# Patient Record
Sex: Female | Born: 1935 | Race: Black or African American | Hispanic: No | Smoking: Never smoker
Health system: Southern US, Community
[De-identification: ages and names within clinical notes are randomized; demographics above are authoritative.]

## PROBLEM LIST (undated history)

## (undated) DIAGNOSIS — E119 Type 2 diabetes mellitus without complications: Secondary | ICD-10-CM

## (undated) DIAGNOSIS — F32A Depression, unspecified: Secondary | ICD-10-CM

## (undated) DIAGNOSIS — F329 Major depressive disorder, single episode, unspecified: Secondary | ICD-10-CM

## (undated) DIAGNOSIS — K219 Gastro-esophageal reflux disease without esophagitis: Secondary | ICD-10-CM

## (undated) DIAGNOSIS — I10 Essential (primary) hypertension: Secondary | ICD-10-CM

## (undated) DIAGNOSIS — L539 Erythematous condition, unspecified: Secondary | ICD-10-CM

## (undated) DIAGNOSIS — M1990 Unspecified osteoarthritis, unspecified site: Secondary | ICD-10-CM

## (undated) HISTORY — DX: Type 2 diabetes mellitus without complications: E11.9

## (undated) HISTORY — PX: CHOLECYSTECTOMY: SHX55

## (undated) HISTORY — DX: Essential (primary) hypertension: I10

## (undated) HISTORY — PX: JOINT REPLACEMENT: SHX530

## (undated) HISTORY — DX: Depression, unspecified: F32.A

## (undated) HISTORY — DX: Major depressive disorder, single episode, unspecified: F32.9

---

## 2015-12-22 ENCOUNTER — Encounter (HOSPITAL_COMMUNITY): Payer: Self-pay

## 2015-12-22 ENCOUNTER — Encounter (HOSPITAL_COMMUNITY): Payer: Medicare Other | Attending: Hematology | Admitting: Hematology

## 2015-12-22 ENCOUNTER — Encounter (HOSPITAL_COMMUNITY): Payer: Medicare Other

## 2015-12-22 VITALS — BP 134/57 | HR 86 | Temp 98.7°F | Resp 18 | Ht 68.0 in | Wt 197.6 lb

## 2015-12-22 DIAGNOSIS — C259 Malignant neoplasm of pancreas, unspecified: Secondary | ICD-10-CM | POA: Insufficient documentation

## 2015-12-22 DIAGNOSIS — R300 Dysuria: Secondary | ICD-10-CM | POA: Diagnosis not present

## 2015-12-22 DIAGNOSIS — E785 Hyperlipidemia, unspecified: Secondary | ICD-10-CM | POA: Diagnosis not present

## 2015-12-22 DIAGNOSIS — I10 Essential (primary) hypertension: Secondary | ICD-10-CM | POA: Diagnosis not present

## 2015-12-22 DIAGNOSIS — E43 Unspecified severe protein-calorie malnutrition: Secondary | ICD-10-CM

## 2015-12-22 DIAGNOSIS — G893 Neoplasm related pain (acute) (chronic): Secondary | ICD-10-CM | POA: Diagnosis not present

## 2015-12-22 DIAGNOSIS — R1013 Epigastric pain: Secondary | ICD-10-CM

## 2015-12-22 DIAGNOSIS — K8681 Exocrine pancreatic insufficiency: Secondary | ICD-10-CM

## 2015-12-22 DIAGNOSIS — C250 Malignant neoplasm of head of pancreas: Secondary | ICD-10-CM | POA: Diagnosis not present

## 2015-12-22 DIAGNOSIS — R109 Unspecified abdominal pain: Secondary | ICD-10-CM | POA: Insufficient documentation

## 2015-12-22 LAB — CBC WITH DIFFERENTIAL/PLATELET
BASOS PCT: 0 %
Basophils Absolute: 0 10*3/uL (ref 0.0–0.1)
Eosinophils Absolute: 0.1 10*3/uL (ref 0.0–0.7)
Eosinophils Relative: 1 %
HEMATOCRIT: 35.5 % — AB (ref 36.0–46.0)
HEMOGLOBIN: 11.8 g/dL — AB (ref 12.0–15.0)
LYMPHS ABS: 2.8 10*3/uL (ref 0.7–4.0)
LYMPHS PCT: 45 %
MCH: 29.6 pg (ref 26.0–34.0)
MCHC: 33.2 g/dL (ref 30.0–36.0)
MCV: 89 fL (ref 78.0–100.0)
MONOS PCT: 6 %
Monocytes Absolute: 0.4 10*3/uL (ref 0.1–1.0)
NEUTROS ABS: 3 10*3/uL (ref 1.7–7.7)
NEUTROS PCT: 48 %
Platelets: 264 10*3/uL (ref 150–400)
RBC: 3.99 MIL/uL (ref 3.87–5.11)
RDW: 13.3 % (ref 11.5–15.5)
WBC: 6.3 10*3/uL (ref 4.0–10.5)

## 2015-12-22 LAB — COMPREHENSIVE METABOLIC PANEL
ALBUMIN: 3.9 g/dL (ref 3.5–5.0)
ALT: 23 U/L (ref 14–54)
AST: 21 U/L (ref 15–41)
Alkaline Phosphatase: 77 U/L (ref 38–126)
Anion gap: 8 (ref 5–15)
BUN: 6 mg/dL (ref 6–20)
CHLORIDE: 96 mmol/L — AB (ref 101–111)
CO2: 25 mmol/L (ref 22–32)
CREATININE: 0.74 mg/dL (ref 0.44–1.00)
Calcium: 8.9 mg/dL (ref 8.9–10.3)
GFR calc Af Amer: >60 mL/min (ref >60)
GFR calc non Af Amer: >60 mL/min (ref >60)
GLUCOSE: 168 mg/dL — AB (ref 65–99)
POTASSIUM: 3.5 mmol/L (ref 3.5–5.1)
SODIUM: 129 mmol/L — AB (ref 135–145)
Total Bilirubin: 0.7 mg/dL (ref 0.3–1.2)
Total Protein: 7.3 g/dL (ref 6.5–8.1)

## 2015-12-22 LAB — URINALYSIS, ROUTINE W REFLEX MICROSCOPIC
BILIRUBIN URINE: NEGATIVE
Glucose, UA: NEGATIVE mg/dL
Hgb urine dipstick: NEGATIVE
Ketones, ur: NEGATIVE mg/dL
Leukocytes, UA: NEGATIVE
NITRITE: NEGATIVE
PH: 8 (ref 5.0–8.0)
Protein, ur: NEGATIVE mg/dL
SPECIFIC GRAVITY, URINE: 1.015 (ref 1.005–1.030)

## 2015-12-22 LAB — MAGNESIUM: MAGNESIUM: 1.9 mg/dL (ref 1.7–2.4)

## 2015-12-22 MED ORDER — PANCRELIPASE (LIP-PROT-AMYL) 24000-76000 UNITS PO CPEP: 1.0000 | ORAL_CAPSULE | Freq: Three times a day (TID) | ORAL | Status: AC

## 2015-12-22 MED ORDER — SENNOSIDES-DOCUSATE SODIUM 8.6-50 MG PO TABS: 2.0000 | ORAL_TABLET | Freq: Two times a day (BID) | ORAL | Status: AC

## 2015-12-22 MED ORDER — OXYCODONE HCL 5 MG PO TABS: 5.0000 mg | ORAL_TABLET | ORAL | Status: AC | PRN

## 2015-12-22 MED ORDER — ONDANSETRON HCL 4 MG PO TABS: 4.0000 mg | ORAL_TABLET | Freq: Three times a day (TID) | ORAL | Status: AC | PRN

## 2015-12-22 NOTE — Progress Notes (Signed)
Marland Kitchen    HEMATOLOGY/ONCOLOGY CONSULTATION NOTE  Date of Service: 12/22/2015  PCP: Dr Stoney Bang MD MS  CHIEF COMPLAINTS/PURPOSE OF CONSULTATION:  Pancreatic Mass  HISTORY OF PRESENTING ILLNESS:   Colleen Riley is a wonderful 82 y.o. female who has been referred to [place] by Dr .[name], MD  for evaluation and management of newly noted pancreatic mass concerning for pancreatic cancer.  Patient has a history of hypertension, diabetes, dyslipidemia, GERD and a family history of colon cancer and pancreatic cancer who presents with several months of worsening abdominal pain, weight loss of about 25-30 pounds since March 2017 and progressive increasing fatigue. She also notes significant abdominal bloating. Patient reports she had issues with urinary retention in May 2017 and had a Foley catheter for 2 weeks after which she developed a urinary tract infection that was treated with antibiotics. Patient notes poor appetite and decreased energy. The pain is predominantly in the mid abdomen and is referred to the back. She apparently had an ultrasound of the abdomen on 12/08/2015 that showed a 3 x 2.7 x 3 cm hypoechoic ill-defined focus in the pancreatic head suggesting a pancreatic mass and pancreatic ductal dilatation of 4 mm. The patient reports that she had a CT abdomen and MRI of the abdomen at [facility] the results and images of which are not available to [place] at this time. As per her primary care physician Dr. Joselyn Arrow note her labs showed a CA-19-9 level of more than 40,000. This led to significant concern for pancreatic cancer for which she was referred to [place] for further evaluation and management.  She has several family members supporting her today. She notes that she really needs something for her pain. Notes some loose stools and a sense of bloating and fullness.   MEDICAL HISTORY:  Past Medical History  Diagnosis Date  . Diabetes mellitus without complication ([place])   .  Hypertension   . Depression   Dyslipidemia Gastroesophageal reflux disease with esophagitis Diabetic neuropathy Osteoarthritis Allergic rhinitis SURGICAL HISTORY: #1 appendectomy #2 cholecystectomy #3 right knee surgery #4 right hip surgery #5 partial thyroidectomy -noted to have benign nodule  SOCIAL HISTORY: Social History   Social History  . Marital Status: Widowed    Spouse Name: N/A  . Number of Children: N/A  . Years of Education: N/A   Occupational History  . Not on file.   Social History Main Topics  . Smoking status: Never Smoker   . Smokeless tobacco: Never Used  . Alcohol Use: No  . Drug Use: No  . Sexual Activity: No   Other Topics Concern  . Not on file   Social History Narrative  . No narrative on file  Patient is widowed and has 7 children Lifelong nonsmoker No significant alcohol use Worked for a long time with [employer] and [employer] and wonders if she had any chemical exposures.  FAMILY HISTORY: Family History  Problem Relation Age of Onset  . Heart failure Mother   . Diabetes Father   . Cancer Sister   Sister - colon cancer at 107 years Sister2-pancreatic cancer Oldest sister- end-stage renal disease on hemodialysis Sister- leukemia  ALLERGIES:  is allergic to peanut-containing drug products; claritin [loratadine]; and feldene [piroxicam].  MEDICATIONS:  Current Outpatient Prescriptions  Medication Sig Dispense Refill  . amLODipine-benazepril (LOTREL) 5-10 MG capsule Take 1 capsule by mouth daily.    Marland Kitchen aspirin 81 MG tablet Take 81 mg by mouth daily.    Marland Kitchen  cetirizine (ZYRTEC) 10 MG tablet Take 10 mg by mouth daily.    . ciprofloxacin (CIPRO) 500 MG tablet Take 500 mg by mouth 2 (two) times daily.    Marland Kitchen FLUoxetine (PROZAC) 10 MG tablet Take 10 mg by mouth daily.    Marland Kitchen glipiZIDE (GLUCOTROL) 10 MG tablet Take 10 mg by mouth daily before breakfast.    . HYDROcodone-acetaminophen (NORCO/VICODIN) 5-325 MG tablet Take 1 tablet by  mouth every 6 (six) hours as needed for moderate pain.    Marland Kitchen LORazepam (ATIVAN) 0.5 MG tablet Take 0.5 mg by mouth at bedtime as needed for anxiety.    Marland Kitchen losartan-hydrochlorothiazide (HYZAAR) 100-12.5 MG tablet Take 1 tablet by mouth daily.    Marland Kitchen lovastatin (MEVACOR) 20 MG tablet Take 20 mg by mouth at bedtime.    . montelukast (SINGULAIR) 10 MG tablet Take 10 mg by mouth at bedtime.    . Multiple Vitamins-Minerals (MULTIVITAMIN WITH MINERALS) tablet Take 1 tablet by mouth daily.    Marland Kitchen omeprazole (PRILOSEC) 40 MG capsule Take 40 mg by mouth daily.    . ondansetron (ZOFRAN) 4 MG tablet Take 4 mg by mouth every 8 (eight) hours as needed for nausea or vomiting.    . saxagliptin HCl (ONGLYZA) 5 MG TABS tablet Take 5 mg by mouth daily.     No current facility-administered medications for this visit.    REVIEW OF SYSTEMS:    10 Point review of Systems was done is negative except as noted above.  PHYSICAL EXAMINATION: ECOG PERFORMANCE STATUS: 2 - Symptomatic, <50% confined to bed  . Filed Vitals:   12/22/15 1335  BP: 134/57  Pulse: 86  Temp: 98.7 F (37.1 C)  Resp: 18   Filed Weights   12/22/15 1335  Weight: 197 lb 9.6 oz (89.631 kg)   .Body mass index is 30.05 kg/(m^2).  GENERAL:alert,Elderly African-American female in mild distress due to abdominal pain and fullness SKIN: skin color, texture, turgor are normal, no rashes or significant lesions EYES: normal, conjunctiva mild pallor, sclera clear OROPHARYNX:no exudate, no erythema and lips, buccal mucosa, and tongue normal  NECK: supple, no JVD, thyroid normal size, non-tender, without nodularity LYMPH:  no palpable lymphadenopathy in the cervical, axillary or inguinal LUNGS: clear to auscultation with normal respiratory effort HEART: regular rate & rhythm,  no murmurs and no lower extremity edema ABDOMEN:  normoactive bowel sounds , distended and somewhat tympanic, tenderness to palpation in the mid and upper abdomen with no  guarding rigidity or rebound. PSYCH: alert & oriented x 3 with fluent speech NEURO: no focal motor/sensory deficits  LABORATORY DATA:  I have reviewed the data as listed   Component     Latest Ref Rng & Units 12/22/2015  WBC     4.0 - 10.5 K/uL 6.3  RBC     3.87 - 5.11 MIL/uL 3.99  Hemoglobin     12.0 - 15.0 g/dL 11.8 (L)  HCT     36.0 - 46.0 % 35.5 (L)  MCV     78.0 - 100.0 fL 89.0  MCH     26.0 - 34.0 pg 29.6  MCHC     30.0 - 36.0 g/dL 33.2  RDW     11.5 - 15.5 % 13.3  Platelets     150 - 400 K/uL 264  Neutrophils     % 48  NEUT#     1.7 - 7.7 K/uL 3.0  Lymphocytes     % 45  Lymphocyte #  0.7 - 4.0 K/uL 2.8  Monocytes Relative     % 6  Monocyte #     0.1 - 1.0 K/uL 0.4  Eosinophil     % 1  Eosinophils Absolute     0.0 - 0.7 K/uL 0.1  Basophil     % 0  Basophils Absolute     0.0 - 0.1 K/uL 0.0  Sodium     135 - 145 mmol/L 129 (L)  Potassium     3.5 - 5.1 mmol/L 3.5  Chloride     101 - 111 mmol/L 96 (L)  CO2     22 - 32 mmol/L 25  Glucose     65 - 99 mg/dL 168 (H)  BUN     6 - 20 mg/dL 6  Creatinine     0.44 - 1.00 mg/dL 0.74  Calcium     8.9 - 10.3 mg/dL 8.9  Total Protein     6.5 - 8.1 g/dL 7.3  Albumin     3.5 - 5.0 g/dL 3.9  AST     15 - 41 U/L 21  ALT     14 - 54 U/L 23  Alkaline Phosphatase     38 - 126 U/L 77  Total Bilirubin     0.3 - 1.2 mg/dL 0.7  EGFR (Non-African Amer.)     >60 mL/min >60  EGFR (African American)     >60 mL/min >60  Anion gap     5 - 15 8  Color, Urine     YELLOW YELLOW  Appearance     CLEAR CLEAR  Specific Gravity, Urine     1.005 - 1.030 1.015  pH     5.0 - 8.0 8.0  Glucose     NEGATIVE mg/dL NEGATIVE  Hgb urine dipstick     NEGATIVE NEGATIVE  Bilirubin Urine     NEGATIVE NEGATIVE  Ketones, ur     NEGATIVE mg/dL NEGATIVE  Protein     NEGATIVE mg/dL NEGATIVE  Nitrite     NEGATIVE NEGATIVE  Leukocytes, UA     NEGATIVE NEGATIVE  Specimen Description      URINE, CLEAN CATCH  Special  Requests      NONE  Culture      MULTIPLE SPECIES PRESENT, SUGGEST RECOLLECTION (A)  Report Status      12/24/2015 FINAL  Magnesium     1.7 - 2.4 mg/dL 1.9  CA 19-9     0 - 35 U/mL 48,735 (H)   RADIOGRAPHIC STUDIES: I have personally reviewed the radiological images as listed and agreed with the findings in the report. No results found.  ASSESSMENT & PLAN:   82 year old African-American female with  #1 pancreatic head mass abdominal pain and distention and an elevated CA-19-9 level of 48k quite concerning for metastatic pancreatic cancer. ECOG performance status of 2 #2 neoplasm related pain #3 exocrine pancreatic insufficiency peripancreatic cancer #4 protein calorie malnutrition Plan -Labs including CBC, CMP ,, CA-19-9 -Try to get imaging reports and images of her CT abdomen and MRI done at [facility]. -PET/CT scan for further evaluation and complete staging and treatment strategy planning for suspected pancreatic cancer. -Would try to biopsy a metastatic lesion if easier . Otherwise the patient might need an EGD/EUS for an endoscopic biopsy of the pancreatic mass . -Pain control -oxycodone when necessary ordered along with mild prophylaxis . -We will likely need to add long-acting pain medications depending a higher need for pain medications . -  Might need to consider a celiac plexus block if she primarily has locally advanced disease as the cause of uncontrolled pain . -Creon added to help with abdominal symptoms bloating and to help with food digestion . -Nutritional consultation  -If pancreatic cancer is diagnosed and if this were locally advanced or metastatic she probably would not be a candidate for aggressive treatments given her age overall functional status and medical comorbidities and might need to consider best supportive cares versus palliative gemcitabine. -Discontinue other nonessential medications -When necessary and Zofran for nausea #5 dysuria -  urinalysis and culture ordered . -Does not show evidence of urinary tract infection this time .  #6 history of hypertension and now with lower blood pressure in the setting of significant weight loss and decreased oral intake. -Will discontinue her hydrochlorothiazide and losartan.  #7 dyslipidemia -Discontinue statins  Return to care with Dr. Whitney Muse in 10 days with repeat CBC, CMP and PET CT scan to determine further biopsy site and management plan.  All of the patients questions were answered with apparent satisfaction. The patient knows to call the clinic with any problems, questions or concerns.  I spent 60 minutes counseling the patient face to face. The total time spent in the appointment was 60 minutes and more than 50% was on counseling and direct patient cares.    Sullivan Lone MD [place] AAHIVMS [facility] Hematology/Oncology Physician [facility]  (Office):       [phone] (Work cell):  [phone] (Fax):           [phone]  12/22/2015 2:01 PM

## 2015-12-22 NOTE — Patient Instructions (Signed)
[place] at [facility] Discharge Instructions  RECOMMENDATIONS MADE BY THE CONSULTANT AND ANY TEST RESULTS WILL BE SENT TO YOUR REFERRING PHYSICIAN.  You were seen by Dr. Irene Limbo today. Lab work today.   We will also collect urine sample from you before you leave. PET scan and CT scan within the next 5 days to a week. We will ask [facility] to send over your CT abdomen and MRI results for Dr. Irene Limbo to review.   Return to clinic after PET scan and CT scan completed. Office will schedule these for you.   Stop taking your Lovastation (cholesterol pill) and HCTZ/Losartan (Blood pressure pill).   New prescriptions have been provided for pancrealipase capsules, (Take these with your meals to help digest your food), Zofran for nausea and Oxycodone for pain.   Stop taking the hydrocodone pills you have for pain because he does not want you to have tylenol.   Call clinic with any questions or concerns.   Thank you for choosing [place] at [facility] to provide your oncology and hematology care.  To afford each patient quality time with our provider, please arrive at least 15 minutes before your scheduled appointment time.   Beginning January 23rd 2017 lab work for the [facility] will be done in the  Main lab at [facility] on 1st floor. If you have a lab appointment with the [facility] please come in thru the  Main Entrance and check in at the main information desk  You need to re-schedule your appointment should you arrive 10 or more minutes late.  We strive to give you quality time with our providers, and arriving late affects you and other patients whose appointments are after yours.  Also, if you no show three or more times for appointments you may be dismissed from the clinic at the providers discretion.     Again, thank you for choosing [facility].  Our hope is that these requests will decrease the amount of time that  you wait before being seen by our physicians.       _____________________________________________________________  Should you have questions after your visit to [facility], please contact our office at [phone] between the hours of 8:30 a.m. and 4:30 p.m.  Voicemails left after 4:30 p.m. will not be returned until the following business day.  For prescription refill requests, have your pharmacy contact our office.         Resources For Cancer Patients and their Caregivers ? American Cancer Society: Can assist with transportation, wigs, general needs, runs Look Good Feel Better.        367-375-1355 ? Cancer Care: Provides financial assistance, online support groups, medication/co-pay assistance.  1-800-813-HOPE 406-629-1780) ? [place] Assists [place] cancer patients and their families through emotional , educational and financial support.  336-126-2024 ? [place] DSS Where to apply for food stamps, Medicaid and utility assistance. 223-192-6214 ? [facility]: Transportation to medical appointments. 435 670 1942 ? Social Security Administration: May apply for disability if have a Stage IV cancer. 661-595-6239 820-045-3305 ? [employer], Disability and Transit Services: Assists with nutrition, care and transit needs. [place] Support Programs: @10RELATIVEDAYS @ > Cancer Support Group  2nd Tuesday of the month 1pm-2pm, Journey Room  > Creative Journey  3rd Tuesday of the month 1130am-1pm, Journey Room  > Look Good Feel Better  1st Wednesday of the month 10am-12 noon,  Journey Room (Call [place] to register [phone])

## 2015-12-23 LAB — CANCER ANTIGEN 19-9: CA 19-9: 48735 U/mL — ABNORMAL HIGH (ref 0–35)

## 2015-12-24 LAB — URINE CULTURE

## 2015-12-30 ENCOUNTER — Ambulatory Visit (HOSPITAL_COMMUNITY)
Admission: RE | Admit: 2015-12-30 | Discharge: 2015-12-30 | Disposition: A | Discharge status: Home / Self Care | Payer: Medicare Other | Source: Ambulatory Visit | Attending: Hematology | Admitting: Hematology

## 2015-12-30 DIAGNOSIS — J439 Emphysema, unspecified: Secondary | ICD-10-CM | POA: Insufficient documentation

## 2015-12-30 DIAGNOSIS — R911 Solitary pulmonary nodule: Secondary | ICD-10-CM | POA: Diagnosis not present

## 2015-12-30 DIAGNOSIS — R590 Localized enlarged lymph nodes: Secondary | ICD-10-CM | POA: Diagnosis not present

## 2015-12-30 DIAGNOSIS — C250 Malignant neoplasm of head of pancreas: Secondary | ICD-10-CM

## 2015-12-30 DIAGNOSIS — I700 Atherosclerosis of aorta: Secondary | ICD-10-CM | POA: Insufficient documentation

## 2015-12-30 LAB — GLUCOSE, CAPILLARY: Glucose-Capillary: 170 mg/dL — ABNORMAL HIGH (ref 65–99)

## 2015-12-30 MED ORDER — FLUDEOXYGLUCOSE F - 18 (FDG) INJECTION: 9.9000 | Freq: Once | INTRAVENOUS | Status: DC | PRN

## 2016-01-05 NOTE — Progress Notes (Signed)
[name], MD [place] / [place] [address]   DIAGNOSIS: Pancreatic Mass  SUMMARY OF ONCOLOGIC HISTORY:   Pancreatic cancer ([place])   08/24/2015 Imaging    CT abdomen/pelvis with contrast, with no evidence acute intra abdominal or intrapelvic abnormality, no CT evicdence pancreatitis. no pancreatic mass or pancreatic ductal dilatation senn. Colonic diverticulosis     09/08/2015 Imaging    MRI abdomen [place] with mild increase caliber of common bile duct status post cholecystecomy. Focal narrowing within the dital common bile duct may represent stricture.      12/08/2015 [facility], 3 x 2.7 x 3 cm hypoechoic ill defined focus in the pancreatic head, pancreatic duct dilatation of 4 mm noted.      12/30/2015 PET scan    Marked hypermetabolic pancreatic head mass with hypermetabolic retroperitoneal LAD, hypermetabolic RUL pulmonary nodule, could be a metastatic lesion or primary bronchogenic neoplasm. appears to be hypermetabolic LAD in the R hilum      CURRENT THERAPY: NONE  INTERVAL HISTORY: Colleen Riley 82 y.o. female returns for follow-up of a pancreatic mass. The patient was originally seen in consultation on 12/22/2015 by Dr. Irene Limbo, at this point the details of that visit are unavailable to me. She presents today after having undergone a PET/CT at [facility] on 7/20.  She apparently has been having abdominal pain off/on for many months. She has undergone multiple modes of imaging including MR/CT and ultrasound dating back to March of this year. She is also reported to have had an elevated lipase. She is able to eat although she notes some weight loss. Denies vomiting. She still lives independently.  She presents today with multiple family members. (6 total)  They all note that the patient does not want to have a scope of any kind. They had multiple questions about surgery. She may be interested in chemotherapy.    MEDICAL HISTORY: Past  Medical History:  Diagnosis Date  . Arthritis   . Depression   . Diabetes mellitus without complication ([place])   . GERD (gastroesophageal reflux disease)   . Hypertension   . Redness    both inner thighs comes and goes    SURGICAL HISTORY: Past Surgical History:  Procedure Laterality Date  . CHOLECYSTECTOMY    . JOINT REPLACEMENT     right hip replacement  . JOINT REPLACEMENT     right knee replacement    SOCIAL HISTORY: Social History   Social History  . Marital status: Widowed    Spouse name: N/A  . Number of children: N/A  . Years of education: N/A   Occupational History  . Not on file.   Social History Main Topics  . Smoking status: Never Smoker  . Smokeless tobacco: Never Used  . Alcohol use No  . Drug use: No  . Sexual activity: No   Other Topics Concern  . Not on file   Social History Narrative  . No narrative on file  Patient lives alone, multiple children. Strong support group.  FAMILY HISTORY: Family History  Problem Relation Age of Onset  . Heart failure Mother   . Diabetes Father   . Cancer Sister     Review of Systems  Constitutional: Positive for malaise/fatigue.  HENT: Negative.   Eyes: Negative.   Respiratory: Negative.   Cardiovascular: Negative.   Gastrointestinal: Positive for abdominal pain and nausea.  Genitourinary: Negative.   Musculoskeletal: Positive for joint pain.  Skin:  Negative.   Neurological: Positive for weakness.  Endo/Heme/Allergies: Negative.   Psychiatric/Behavioral: Negative.     PHYSICAL EXAMINATION  ECOG PERFORMANCE STATUS: 1 - Symptomatic but completely ambulatory  Vitals:   01/06/16 0800  BP: 123/64  Pulse: 74  Resp: 16  Temp: 98.7 F (37.1 C)    Physical Exam  Constitutional: She is oriented to person, place, and time and well-developed, well-nourished, and in no distress.  In wheelchair  HENT:  Head: Normocephalic and atraumatic.  Nose: Nose normal.  Mouth/Throat: Oropharynx is clear and  moist. No oropharyngeal exudate.  Eyes: Conjunctivae and EOM are normal. Pupils are equal, round, and reactive to light. Right eye exhibits no discharge. Left eye exhibits no discharge. No scleral icterus.  Neck: Normal range of motion. Neck supple. No tracheal deviation present. No thyromegaly present.  Cardiovascular: Normal rate, regular rhythm and normal heart sounds.  Exam reveals no gallop and no friction rub.   No murmur heard. Pulmonary/Chest: Effort normal and breath sounds normal. She has no wheezes. She has no rales.  Abdominal: Soft. Bowel sounds are normal. She exhibits no distension and no mass. There is tenderness. There is guarding. There is no rebound.  Musculoskeletal: Normal range of motion. She exhibits no edema.  Lymphadenopathy:    She has no cervical adenopathy.  Neurological: She is alert and oriented to person, place, and time. No cranial nerve deficit. Coordination normal.  Skin: Skin is warm and dry. No rash noted.  Psychiatric: Mood, memory, affect and judgment normal.  Nursing note and vitals reviewed.   LABORATORY DATA: I have reviewed the data as listed. CBC    Component Value Date/Time   WBC 6.9 01/06/2016 0801   RBC 3.91 01/06/2016 0801   HGB 11.9 (L) 01/06/2016 0801   HCT 35.1 (L) 01/06/2016 0801   PLT 282 01/06/2016 0801   MCV 89.8 01/06/2016 0801   MCH 30.4 01/06/2016 0801   MCHC 33.9 01/06/2016 0801   RDW 13.5 01/06/2016 0801   LYMPHSABS 2.3 01/06/2016 0801   MONOABS 0.4 01/06/2016 0801   EOSABS 0.1 01/06/2016 0801   BASOSABS 0.0 01/06/2016 0801    CMP     Component Value Date/Time   NA 134 (L) 01/06/2016 0801   K 3.7 01/06/2016 0801   CL 105 01/06/2016 0801   CO2 25 01/06/2016 0801   GLUCOSE 192 (H) 01/06/2016 0801   BUN 8 01/06/2016 0801   CREATININE 0.68 01/06/2016 0801   CALCIUM 9.1 01/06/2016 0801   PROT 7.2 01/06/2016 0801   ALBUMIN 3.7 01/06/2016 0801   AST 23 01/06/2016 0801   ALT 26 01/06/2016 0801   ALKPHOS 80  01/06/2016 0801   BILITOT 0.7 01/06/2016 0801   GFRNONAA >60 01/06/2016 0801   GFRAA >60 01/06/2016 0801       RADIOGRAPHIC STUDIES: I have personally reviewed the radiological images as listed and agreed with the findings in the report. Study Result   CLINICAL DATA:  Initial treatment strategy for pancreatic cancer.  EXAM: NUCLEAR MEDICINE PET SKULL BASE TO THIGH  TECHNIQUE: 9.9 mCi F-18 FDG was injected intravenously. Full-ring PET imaging was performed from the skull base to thigh after the radiotracer. CT data was obtained and used for attenuation correction and anatomic localization.  FASTING BLOOD GLUCOSE:  Value: 170 mg/dl  COMPARISON:  None.  FINDINGS: NECK  No hypermetabolic lymph nodes in the neck.  CHEST  11 mm right upper lobe pulmonary nodule is hypermetabolic with SUV max = 9.4. Hypermetabolism in the right  hilum is suspicious for metastatic lymphadenopathy although a discrete lymph node cannot be identified on the CT scan performed without intravenous contrast material.  Lung windows show emphysema. Scattered small bilateral pulmonary nodules are below the threshold for reliable resolution on PET imaging.  ABDOMEN/PELVIS  Lesion in the head of the pancreas measures approximately 4.1 cm. Hypermetabolism in this lesion demonstrates SUV max = 8.0. Small left para-aortic lymph node (Image 102 series 4) is hypermetabolic with SUV max = 4.9 and abnormal soft tissue between the SMA in the aorta (image 105 series 4) is hypermetabolic with SUV max = 7.3.  Gallbladder surgically absent. Right renal cysts noted. Small volume intraperitoneal free fluid.  SKELETON  Patient is status post right hip replacement.  IMPRESSION: 1. Markedly hypermetabolic pancreatic head mass with hypermetabolic retroperitoneal lymphadenopathy. 2. Hypermetabolic right upper lobe pulmonary nodule. This could be a metastatic lesion or primary bronchogenic  neoplasm. There appears to be hypermetabolic lymphadenopathy in the right hilum although this is not clearly discernible on uninfused CT imaging. 3. Emphysema 4. Abdominal aortic atherosclerosis.   Electronically Signed   By: Misty Stanley M.D.   On: 12/30/2015 14:36    ASSESSMENT and THERAPY PLAN:  Pancreatic head mass RUL pulmonary nodule Abdominal pain Elevated CA 19-9 hyponatremia  I spoke with the family in detail that at this point clinical scenario is certainly c/w pancreatic carcinoma. CA 19-9 is markedly elevated. Given retroperitoneal LAD on PET/CT I suspect stage IV disease. The exact nature of the pulmonary nodule at this point is uncertain but I do not think pursuing biopsy at this point would be necessary.  Given her age and PET findings I do not feel she would be a surgical candidate but if they desire surgical consultation I would be glad to refer them to Dr. Crisoforo Oxford or Dr. Eugenia Pancoast at [facility].  I advised them that If she opted to pursue therapy she is a reasonable candidate for treatment however, it is considered standard of care to biopsy prior to treating. I would recommend referral to [facility] to Dr. Ardis Hughs for EUS and biopsy.  At this point they are not certain if they want to proceed with biopsy, they have met our patient navigator today and I advised them to call if they wish to be referred for biopsy. If they wish for surgical referral to also let me know. I am glad to assist however they decide.  All questions were answered. The patient knows to call the clinic with any problems, questions or concerns. We can certainly see the patient much sooner if necessary.  This document serves as a record of services personally performed by Ancil Linsey, MD. It was created on her behalf by Arlyce Harman, a trained medical scribe. The creation of this record is based on the scribe's personal observations and the provider's statements to them. This document has been checked and  approved by the attending provider.  I have reviewed the above documentation for accuracy and completeness, and I agree with the above.  This note was electronically signed. Molli Hazard, MD 01/25/2016

## 2016-01-06 ENCOUNTER — Encounter (HOSPITAL_BASED_OUTPATIENT_CLINIC_OR_DEPARTMENT_OTHER): Payer: Medicare Other | Admitting: Hematology & Oncology

## 2016-01-06 ENCOUNTER — Encounter (HOSPITAL_COMMUNITY): Payer: Self-pay | Admitting: Hematology & Oncology

## 2016-01-06 ENCOUNTER — Encounter (HOSPITAL_COMMUNITY): Payer: Medicare Other

## 2016-01-06 ENCOUNTER — Encounter: Payer: Self-pay | Admitting: Dietician

## 2016-01-06 VITALS — BP 123/64 | HR 74 | Temp 98.7°F | Resp 16 | Wt 197.0 lb

## 2016-01-06 DIAGNOSIS — R109 Unspecified abdominal pain: Secondary | ICD-10-CM

## 2016-01-06 DIAGNOSIS — R970 Elevated carcinoembryonic antigen [CEA]: Secondary | ICD-10-CM | POA: Diagnosis not present

## 2016-01-06 DIAGNOSIS — R911 Solitary pulmonary nodule: Secondary | ICD-10-CM

## 2016-01-06 DIAGNOSIS — C250 Malignant neoplasm of head of pancreas: Secondary | ICD-10-CM

## 2016-01-06 DIAGNOSIS — E871 Hypo-osmolality and hyponatremia: Secondary | ICD-10-CM | POA: Diagnosis not present

## 2016-01-06 LAB — COMPREHENSIVE METABOLIC PANEL
ALBUMIN: 3.7 g/dL (ref 3.5–5.0)
ALK PHOS: 80 U/L (ref 38–126)
ALT: 26 U/L (ref 14–54)
ANION GAP: 4 — AB (ref 5–15)
AST: 23 U/L (ref 15–41)
BILIRUBIN TOTAL: 0.7 mg/dL (ref 0.3–1.2)
BUN: 8 mg/dL (ref 6–20)
CALCIUM: 9.1 mg/dL (ref 8.9–10.3)
CO2: 25 mmol/L (ref 22–32)
Chloride: 105 mmol/L (ref 101–111)
Creatinine, Ser: 0.68 mg/dL (ref 0.44–1.00)
GFR calc non Af Amer: >60 mL/min (ref >60)
GLUCOSE: 192 mg/dL — AB (ref 65–99)
Potassium: 3.7 mmol/L (ref 3.5–5.1)
Sodium: 134 mmol/L — ABNORMAL LOW (ref 135–145)
TOTAL PROTEIN: 7.2 g/dL (ref 6.5–8.1)

## 2016-01-06 LAB — CBC WITH DIFFERENTIAL/PLATELET
Basophils Absolute: 0 10*3/uL (ref 0.0–0.1)
Basophils Relative: 0 %
Eosinophils Absolute: 0.1 10*3/uL (ref 0.0–0.7)
Eosinophils Relative: 1 %
HEMATOCRIT: 35.1 % — AB (ref 36.0–46.0)
HEMOGLOBIN: 11.9 g/dL — AB (ref 12.0–15.0)
LYMPHS ABS: 2.3 10*3/uL (ref 0.7–4.0)
LYMPHS PCT: 33 %
MCH: 30.4 pg (ref 26.0–34.0)
MCHC: 33.9 g/dL (ref 30.0–36.0)
MCV: 89.8 fL (ref 78.0–100.0)
MONOS PCT: 6 %
Monocytes Absolute: 0.4 10*3/uL (ref 0.1–1.0)
NEUTROS ABS: 4.1 10*3/uL (ref 1.7–7.7)
NEUTROS PCT: 60 %
Platelets: 282 10*3/uL (ref 150–400)
RBC: 3.91 MIL/uL (ref 3.87–5.11)
RDW: 13.5 % (ref 11.5–15.5)
WBC: 6.9 10*3/uL (ref 4.0–10.5)

## 2016-01-06 MED ORDER — FENTANYL 12 MCG/HR TD PT72: MEDICATED_PATCH | TRANSDERMAL | 0 refills | Status: DC

## 2016-01-06 MED ORDER — FENTANYL 12 MCG/HR TD PT72: 25.0000 ug | MEDICATED_PATCH | TRANSDERMAL | 0 refills | Status: DC

## 2016-01-06 NOTE — Progress Notes (Signed)
Patient is a new Pancreatic Cancer Pt.  Also has DM, HTN, Depression   Little documented history available.  Contacted Pt by visiting before office visit. She is accompanied by many family members.   Wt Readings from Last 10 Encounters:  01/06/16 197 lb (89.4 kg)  12/22/15 197 lb 9.6 oz (89.6 kg)   Per pt report. Her UBW was 220 lbs and family reports she weighed 222 lbs in March. This would be a 25 lb weight loss in roughly 4 months (>10%).   Patient reports oral intake as slightly decreased. She is still eating 3 meals, but her son(?) states she is eating "smaller portions" at each meal. She is not taking any oral nutritional supplements at this time. She is taking a centrum MVI. Other than her slightly decreased appetite, she is suffering from constipation, which is new and likely related to being started on pain meds. She says she started taking Senna-S. She had diarrhea prior to her diagnosis, but says she no longer does. She takes Creon now, however she is taking it after her meal. Instructed to take either with meal, or 5-10 minutes before a meal. She noted an occasion she had an "all white" stool. She thought it was because she consumed mashed potatoes.   In terms of diabetic control, she is not on Insulin. She is on glipizide and Saxagliptin. She says recently her BG checks have been nearing 200 mg/dl. She has not touched base with her endocrinologist since diagnosis and may need to get her dosages adjusted. Also, glipizide has higher risk of hypoglycemia and if her intake becomes very poor, may need changed.    RD discussed how pancreatic cancer is a nutritionally high risk cancer due to the fact the pancreas is essential in digestion/metabolism. RD told her to watch for signs of malabsorption ie steatorhea. Because pt's appetite is poorer, patient needs to prioritize calorie/protein dense foods. RD went over the "Big Three" of cheese, Peanut butter and eggs which are some of the most  common foods that are high in both kcals and protein. She says she is allergic to peanuts She has never had anaphylactic shock episode. She does eat Egg/cheese frequently. RD went over other high protein/kcal foods. Patient reports eating meat frequently. Asked her to continue this habit.   RD recommended trying to transition to 6 small meals instead of 3 large meals. Additionally, recommended she find a supplement that she likes. She notes that many of them are too sweet, but she will drink them. RD went over ensure assistance program today. Son asked for a case now. RD provided one. She will be eligible for two more.   RD went over hydration. She says she drinks a lot of fluids which she quantified as a a few bottles of water a day. She drinks sweet tea, juice and water almost exclusively. She does not like milk.   Pt seems to be fairly well nourished at this point. Family members were very receptive to all recommendations and appear to be very supportive.   RD to follow.   Left my contact info, coupons, Ensure Case, and handouts titled "Increasing Calories and Protein"   Burtis Junes RD, LDN, [place] Pager: [phone] 01/06/2016 9:00 AM

## 2016-01-06 NOTE — Patient Instructions (Signed)
[place] at [facility] Discharge Instructions  RECOMMENDATIONS MADE BY THE CONSULTANT AND ANY TEST RESULTS WILL BE SENT TO YOUR REFERRING PHYSICIAN.  You saw Dr.Penland today. We will call son with appointment. Read the constipation sheet to help with constipation. Apply one pain patch today-if pain is no better in 48 hours,remove pain patch and apply 2 pain patches. If pain is no better in 48 hours, remove the 2 pain patches and apply 3. Keep increasing patches until pain is under control. Do not run out of patches. Call before you run out.  Thank you for choosing [place] at [facility] to provide your oncology and hematology care.  To afford each patient quality time with our provider, please arrive at least 15 minutes before your scheduled appointment time.   Beginning January 23rd 2017 lab work for the [facility] will be done in the  Main lab at [facility] on 1st floor. If you have a lab appointment with the [facility] please come in thru the  Main Entrance and check in at the main information desk  You need to re-schedule your appointment should you arrive 10 or more minutes late.  We strive to give you quality time with our providers, and arriving late affects you and other patients whose appointments are after yours.  Also, if you no show three or more times for appointments you may be dismissed from the clinic at the providers discretion.     Again, thank you for choosing [facility].  Our hope is that these requests will decrease the amount of time that you wait before being seen by our physicians.       _____________________________________________________________  Should you have questions after your visit to [facility], please contact our office at [phone] between the hours of 8:30 a.m. and 4:30 p.m.  Voicemails left after 4:30 p.m. will not be returned until the following business day.  For  prescription refill requests, have your pharmacy contact our office.         Resources For Cancer Patients and their Caregivers ? American Cancer Society: Can assist with transportation, wigs, general needs, runs Look Good Feel Better.        913-164-7541 ? Cancer Care: Provides financial assistance, online support groups, medication/co-pay assistance.  1-800-813-HOPE 716 768 2497) ? [facility] Assists [place] cancer patients and their families through emotional , educational and financial support.  430-330-1133 ? [place] DSS Where to apply for food stamps, Medicaid and utility assistance. (502) 145-6935 ? [facility]: Transportation to medical appointments. 323-153-1729 ? Social Security Administration: May apply for disability if have a Stage IV cancer. 757-468-3571 5877601039 ? [employer], Disability and Transit Services: Assists with nutrition, care and transit needs. [place] Support Programs: @10RELATIVEDAYS @ > Cancer Support Group  2nd Tuesday of the month 1pm-2pm, Journey Room  > Creative Journey  3rd Tuesday of the month 1130am-1pm, Journey Room  > Look Good Feel Better  1st Wednesday of the month 10am-12 noon, Journey Room (Call [facility] to register [phone])   ]fentanyl skin patch What is this medicine? FENTANYL (FEN ta nil) is a pain reliever. It is used to treat persistent, moderate to severe chronic pain. It is used only by people who have been taking an opioid or narcotic pain medicine for more than one week. This medicine may be used for other purposes; ask your health care provider  or pharmacist if you have questions. What should I tell my health care provider before I take this medicine? They need to know if you have any of these conditions: -brain tumor -Crohn's disease, inflammatory bowel disease, or ulcerative colitis -drug abuse or addiction -head injury -heart disease -if  you frequently drink alcohol containing drinks -kidney disease or problems going to the bathroom -liver disease -lung disease, asthma, or breathing problems -mental problems -skin problems -taken isocarboxazid, phenelzine, tranylcypromine, or selegiline in the past 2 weeks -an allergic or unusual reaction to fentanyl, meperidine, other medicines, foods, dyes, or preservatives -pregnant or trying to get pregnant -breast-feeding How should I use this medicine? Apply the patch to your skin. Do not cut or damage the patch. A cut or damaged patch can be very dangerous because you may get too much medicine. Select a clean, dry area of skin above your waist on your front or back. The upper back is a good spot to put the patch on children or people who are confused because it will be hard for them to remove the patch. Do not apply the patch to oily, broken, burned, cut, or irritated skin. Use only water to clean the area. Do not use soap or alcohol to clean the skin because this can increase the effects of the medicine. If the area is hairy, clip the hair with scissors, but do not shave. Take the patch out of its wrapper, and take off the protective strip over the sticky part. Do not use a patch if the packaging or backing is damaged. Do not touch the sticky part with your fingers. Press the sticky surface to the skin using the palm of your hand. Press the patch to the skin for 30 seconds. Wash your hands at once with soap and water. Keep patches far away from children. Do not let children see you apply the patch and do not apply it where children can see it. Do not call the patch a sticker, tattoo, or bandage, as this could encourage the child to mimic your actions. Used patches still contain medicine. Children or pets can have serious side effects or die from putting used patches in their mouth or on their bodies. Take off the old patch before putting on a new patch. Apply each new patch to a different area  of skin. If a patch comes off or causes irritation, remove it and apply a new patch to different site. If the edges of the patch start to loosen, first apply first aid tape to the edges of the patch. If problems with the patch not sticking continue, cover the patch with a see-through adhesive dressing (like Bioclusive or Tegaderm). Never cover the patch with any other bandage or tape. To get rid of used patches, fold the patch in half with the sticky sides together. Then, flush it down the toilet. Do not discard the patch in the garbage. Pets and children can be harmed if they find used or lost patches. Replace the patch every 3 days or as directed by your doctor or health care professional. Follow the directions on the prescription label. Do not take more medicine than you are told to take. A special MedGuide will be given to you by the pharmacist with each prescription and refill. Be sure to read this information carefully each time. Talk to your pediatrician regarding the use of this medicine in children. While this drug may be prescribed for children as young as 75 years old for selected  conditions, precautions do apply. If someone accidentally uses a fentanyl patch and is not awake and alert, immediately call 911 for help. If the person is awake and alert, call a doctor, health care professional, or the [employer]. Overdosage: If you think you have taken too much of this medicine contact a poison control center or emergency room at once. NOTE: This medicine is only for you. Do not share this medicine with others. What if I miss a dose? If you forget to replace your patch, take off the old patch and put on a new patch as soon as you can. Do not apply an extra patch to your skin. Do not wear more than one patch at the same time unless told to do so by your doctor or health care professional. What may interact with this  medicine? -alcohol -antihistamines -clarithromycin -diltiazem -erythromycin -grapefruit juice -herbal products that contain St. John's wort -itraconazole -ketoconazole -medicines for depression, anxiety, or psychotic disturbances -medicines for sleep -muscle relaxants -naltrexone -narcotic medicines (opiates) for pain -nelfinavir -nicardipine -phenobarbital, phenytoin, or fosphenytoin -rifampin -ritonavir -troleandomycin -verapamil This list may not describe all possible interactions. Give your health care provider a list of all the medicines, herbs, non-prescription drugs, or dietary supplements you use. Also tell them if you smoke, drink alcohol, or use illegal drugs. Some items may interact with your medicine. What should I watch for while using this medicine? Other pain medicine may be needed the first day you use the patch because the patch can take some time to start working. Tell your doctor or health care professional if your pain does not go away, if it gets worse, or if you have new or a different type of pain. You may develop tolerance to the medicine. Tolerance means that you will need a higher dose of the medicine for pain relief. Tolerance is normal and is expected if you take the medicine for a long time. Have a family member watch you for side effects during the first day you wear a patch or if your dose changes. Do not suddenly stop taking your medicine because you may develop a severe reaction. Your body becomes used to the medicine. This does NOT mean you are addicted. Addiction is a behavior related to getting and using a drug for a non-medical reason. If you have pain, you have a medical reason to take pain medicine. Your doctor will tell you how much medicine to take. If your doctor wants you to stop the medicine, the dose will be slowly lowered over time to avoid any side effects. You may get drowsy or dizzy when you first start taking the medicine or change doses.  Do not drive, use machinery, or do anything that may be dangerous until you know how the medicine affects you. Stand or sit up slowly. There are different types of narcotic medicines (opiates) for pain. If you take more than one type at the same time, you may have more side effects. Give your health care provider a list of all medicines you use. Your doctor will tell you how much medicine to take. Do not take more medicine than directed. Call emergency for help if you have problems breathing. The medicine will cause constipation. Try to have a bowel movement at least every 2 to 3 days. If you do not have a bowel movement for 3 days, call your doctor or health care professional. Your mouth may get dry. Drinking water, chewing sugarless gum, or sucking on hard candy  may help. See your dentist every 6 months. Heat can increase the amount of medicine released from the patch. Do not get the patch hot by using heating pads, heated water beds, electric blankets, and heat lamps. You can bathe or swim while using the patch. But, do not use a sauna or hot tub. Tell you doctor or health care professional if you get a fever. If gel leaks from the patch, wash your skin well with water only. Do not use soap or cleansers containing alcohol. Remove the patch from your skin and discard as instructed above. Medicine from a partly attached or leaking patch can transfer to a child or pet when they are being held. Exposure of children or pets to the patch can cause serious side effects and even death. If you are going to have a magnetic resonance imaging (MRI) procedure, tell your MRI technician if you have this patch on your body. It must be removed before a MRI. What side effects may I notice from receiving this medicine? Side effects that you should report to your doctor or health care professional as soon as possible: -allergic reactions like skin rash, itching or hives, swelling of the face, lips, or tongue -breathing  problems -changes in vision -confusion -feeling faint, lightheaded -fever, flu-like symptoms -hallucination -high or low blood pressure -irregular heartbeat -problems with balance, talking, walking -trouble passing urine or change in the amount of urine -unusual bleeding or bruising -unusually weak or tired Side effects that usually do not require medical attention (report to your doctor or health care professional if they continue or are bothersome): -constipation -dry mouth -itching where the patch was applied -loss of appetite -nausea, vomiting -sweating This list may not describe all possible side effects. Call your doctor for medical advice about side effects. You may report side effects to FDA at 1-800-FDA-1088. Where should I keep my medicine? Keep out of the reach of children. This medicine can be abused. Keep your medicine in a safe place to protect it from theft. Do not share this medicine with anyone. Selling or giving away this medicine is dangerous and against the law. Store below 77 degrees F (25 degrees C). Do not store the patches out of their wrappers. This medicine may cause accidental overdose and death if it is taken by other adults, children, or pets. Flush any unused medicine down the toilet as instructed above to reduce the chance of harm. Do not use the medicine after the expiration date. NOTE: This sheet is a summary. It may not cover all possible information. If you have questions about this medicine, talk to your doctor, pharmacist, or health care provider.    2016, Elsevier/Gold Standard. (2012-12-25 17:25:22)

## 2016-01-07 ENCOUNTER — Telehealth (HOSPITAL_COMMUNITY): Payer: Self-pay | Admitting: *Deleted

## 2016-01-07 ENCOUNTER — Other Ambulatory Visit (HOSPITAL_COMMUNITY): Payer: Self-pay | Admitting: Emergency Medicine

## 2016-01-07 NOTE — Progress Notes (Signed)
Called pts son to go over labs.  Dr Ardis Hughs will be in touch with them about scheduled his mothers EUS.  If they have not heard anything by Monday to please call us back.  Verbalized understanding.

## 2016-01-07 NOTE — Telephone Encounter (Signed)
Yes I took care of this

## 2016-01-08 ENCOUNTER — Encounter (HOSPITAL_COMMUNITY): Payer: Self-pay | Admitting: Hematology & Oncology

## 2016-01-10 ENCOUNTER — Other Ambulatory Visit (HOSPITAL_COMMUNITY): Payer: Self-pay | Admitting: Emergency Medicine

## 2016-01-10 ENCOUNTER — Other Ambulatory Visit: Payer: Self-pay

## 2016-01-10 ENCOUNTER — Telehealth: Payer: Self-pay

## 2016-01-10 ENCOUNTER — Encounter (HOSPITAL_COMMUNITY): Payer: Self-pay | Admitting: Hematology & Oncology

## 2016-01-10 DIAGNOSIS — K8689 Other specified diseases of pancreas: Secondary | ICD-10-CM

## 2016-01-10 NOTE — Progress Notes (Unsigned)
Son Fulton Reek called and wanted to know about Dr Ardis Hughs appt.  I called Dr Ardis Hughs office.  Dr Ardis Hughs got the message this morning and they will be calling with the appt soon.  As far as the [place], I gave Fulton Reek my e-mail address so he could attach the form and I could look at it to see what I could fill out for him.  I also contacted Abby Potash and she said she would help me tomorrow with filling the paper work out.  Benny verbalized understanding.

## 2016-01-10 NOTE — Telephone Encounter (Signed)
Left message on machine to call back EUS 01/13/16 1130 am instructions on My Chart

## 2016-01-10 NOTE — Telephone Encounter (Signed)
EUS scheduled, pt instructed and medications reviewed.  Patient instructions mailed to home.  Patient to call with any questions or concerns.  

## 2016-01-10 NOTE — Telephone Encounter (Signed)
-----   Message from Milus Banister, MD sent at 01/10/2016  6:57 AM EDT ----- Vashti Hey work on the EUS.  thanks  Nikia Levels, She needs upper EUS, radial +/- linear, this Thursday (3rd) to follow my 10:30 case, ++MAC.  For pancreatic mass.   Thanks    ----- Message ----- From: Baird Cancer, PA-C Sent: 01/06/2016   5:31 PM To: Milus Banister, MD  Thank you.  Yes, can you please get her on your schedule for EUS FNA.  We will let the family know that she is not a surgical candidate and go from there in that regard.  Dr. Irene Limbo, who has been helping [place] from [place] with the influx of new patients saw her in consultation.  His note is not yet available.  HOWEVER, Dr. Whitney Muse said she will get her note done for you (from today).  Thanks  TK ----- Message ----- From: Milus Banister, MD Sent: 01/06/2016  11:05 AM To: Baird Cancer, PA-C  Ok, I was able to see the PET images. The mass in pancreas is definitely amenable to EUS FNA.  I'm happy to get her on schedule for that if you'd like.  No surgeon would offer her a pancreatic surgery at her age, with PET findings. Again, though, sometimes people need to hear that from a surgeon and so if she, family insists then I think referring her to surgeon is reasonable.  Can you forward me copy of office note when available also. Thanks  DJ

## 2016-01-11 ENCOUNTER — Encounter: Payer: Self-pay | Admitting: *Deleted

## 2016-01-11 ENCOUNTER — Encounter (HOSPITAL_COMMUNITY): Payer: Self-pay | Admitting: *Deleted

## 2016-01-11 ENCOUNTER — Encounter (HOSPITAL_COMMUNITY): Payer: Self-pay | Admitting: Emergency Medicine

## 2016-01-11 ENCOUNTER — Telehealth: Payer: Self-pay | Admitting: Gastroenterology

## 2016-01-11 NOTE — Progress Notes (Unsigned)
Called son ben and let him know that it would be good standard of care that we would need a tissue diagnsis before we start treatment.  We would get this through the EUS.  If we started treatment without  Tissue and the pt did not respond we would not know where to go from there. Ben verbalized understanding of all of this.

## 2016-01-11 NOTE — Progress Notes (Signed)
[facility] [facility] Clinical Social Work  Clinical Social Work was referred by nurse for assessment of psychosocial needs due to questions about Cancer Care. Clinical Social Worker contacted patient's son and left detailed message at home. Son returned call and CSW answered questions about Cancer Care process. Pt needs treatment plan and diagnosis in order to complete paperwork fully. Pt lives alone, but has strong support from extended family and her church. CSW to follow and educated son on additional support programs locally and at [facility].     Clinical Social Work interventions: Education and referral  [place], [place] Tuesdays   [phone]

## 2016-01-11 NOTE — Progress Notes (Signed)
Colleen Riley called and wanted to put off the EUS.  I told him to contact Dr Ardis Hughs office to re-schedule this appointment.

## 2016-01-12 ENCOUNTER — Other Ambulatory Visit (HOSPITAL_COMMUNITY): Payer: Self-pay | Admitting: Hematology & Oncology

## 2016-01-12 NOTE — Telephone Encounter (Signed)
Call placed to the number left on my voice mail regarding the pt and the number is not a good working number.  Message left on the home number again to have the pt return call.

## 2016-01-12 NOTE — Telephone Encounter (Signed)
The pt appt has been rescheduled to 01/27/16 per son request

## 2016-01-12 NOTE — Telephone Encounter (Signed)
Left message on machine to call back  

## 2016-01-13 ENCOUNTER — Encounter (HOSPITAL_COMMUNITY): Payer: Self-pay

## 2016-01-13 MED ORDER — FENTANYL 25 MCG/HR TD PT72: 25.0000 ug | MEDICATED_PATCH | TRANSDERMAL | 0 refills | Status: DC

## 2016-01-14 ENCOUNTER — Encounter (HOSPITAL_COMMUNITY): Payer: Self-pay | Admitting: *Deleted

## 2016-01-24 ENCOUNTER — Encounter (HOSPITAL_COMMUNITY): Payer: Self-pay | Admitting: Hematology & Oncology

## 2016-01-25 ENCOUNTER — Telehealth (HOSPITAL_COMMUNITY): Payer: Self-pay | Admitting: Emergency Medicine

## 2016-01-25 ENCOUNTER — Encounter (HOSPITAL_COMMUNITY): Payer: Self-pay

## 2016-01-25 ENCOUNTER — Encounter (HOSPITAL_COMMUNITY): Payer: Self-pay | Admitting: Hematology & Oncology

## 2016-01-25 NOTE — Progress Notes (Unsigned)
Son was called after all information of symptoms and current medications for pain control were discussed with Robynn Pane, PA.  PA suggests to either be sure to administer the Oxycodone 5mg  Q4H for breakthrough pain, or to add a 12.71mcg Fentanyl patch in addition to the 32mcg that we just increased her to on 01/12/16.  They are to report any further complication or uncontrolled symptoms, if any, in 24 hours.  Son verbalized understanding.

## 2016-01-25 NOTE — Telephone Encounter (Signed)
Called Colleen Riley to let him know about follow up appt with Dr Whitney Muse on 02/07/2016.  He is very open to hospice if and when the time comes.  The other family member will need to hear this.  Colleen Riley has been in a lot more pain.  He added another 12.63mcg patch this am.  Making the dose 37.6mcg.  I told him to call if this did not help.  He was very Patent attorney.

## 2016-01-26 NOTE — Anesthesia Preprocedure Evaluation (Addendum)
Anesthesia Evaluation  Patient identified by MRN, date of birth, ID band  Reviewed: Allergy & Precautions, NPO status , Patient's Chart, lab work & pertinent test results  Airway Mallampati: III  TM Distance: >3 FB Neck ROM: Full    Dental  (+) Upper Dentures, Lower Dentures   Pulmonary neg pulmonary ROS,    Pulmonary exam normal        Cardiovascular hypertension, Pt. on medications Normal cardiovascular exam     Neuro/Psych PSYCHIATRIC DISORDERS Depression    GI/Hepatic GERD  ,Pancreatic cancer   Endo/Other  diabetes, Type 2  Renal/GU negative Renal ROS     Musculoskeletal   Abdominal   Peds  Hematology   Anesthesia Other Findings   Reproductive/Obstetrics                            Anesthesia Physical Anesthesia Plan  ASA: III  Anesthesia Plan: MAC   Post-op Pain Management:    Induction: Intravenous  Airway Management Planned: Nasal Cannula  Additional Equipment: None  Intra-op Plan:   Post-operative Plan:   Informed Consent: I have reviewed the patients History and Physical, chart, labs and discussed the procedure including the risks, benefits and alternatives for the proposed anesthesia with the patient or authorized representative who has indicated his/her understanding and acceptance.   Dental advisory given  Plan Discussed with: CRNA and Surgeon  Anesthesia Plan Comments:         Anesthesia Quick Evaluation

## 2016-01-27 ENCOUNTER — Ambulatory Visit (HOSPITAL_COMMUNITY): Payer: Medicare Other | Admitting: Anesthesiology

## 2016-01-27 ENCOUNTER — Encounter (HOSPITAL_COMMUNITY)
Admission: RE | Disposition: A | Discharge status: Home Health | Payer: Self-pay | Source: Ambulatory Visit | Attending: Gastroenterology

## 2016-01-27 ENCOUNTER — Ambulatory Visit (HOSPITAL_COMMUNITY)
Admission: RE | Admit: 2016-01-27 | Discharge: 2016-01-27 | Disposition: A | Discharge status: Home Health | Payer: Medicare Other | Source: Ambulatory Visit | Attending: Gastroenterology | Admitting: Gastroenterology

## 2016-01-27 ENCOUNTER — Encounter (HOSPITAL_COMMUNITY): Payer: Self-pay | Admitting: Gastroenterology

## 2016-01-27 DIAGNOSIS — E119 Type 2 diabetes mellitus without complications: Secondary | ICD-10-CM | POA: Diagnosis not present

## 2016-01-27 DIAGNOSIS — F329 Major depressive disorder, single episode, unspecified: Secondary | ICD-10-CM | POA: Diagnosis not present

## 2016-01-27 DIAGNOSIS — I10 Essential (primary) hypertension: Secondary | ICD-10-CM | POA: Insufficient documentation

## 2016-01-27 DIAGNOSIS — Z79899 Other long term (current) drug therapy: Secondary | ICD-10-CM | POA: Insufficient documentation

## 2016-01-27 DIAGNOSIS — Z7982 Long term (current) use of aspirin: Secondary | ICD-10-CM | POA: Insufficient documentation

## 2016-01-27 DIAGNOSIS — C259 Malignant neoplasm of pancreas, unspecified: Secondary | ICD-10-CM | POA: Insufficient documentation

## 2016-01-27 DIAGNOSIS — K219 Gastro-esophageal reflux disease without esophagitis: Secondary | ICD-10-CM | POA: Insufficient documentation

## 2016-01-27 DIAGNOSIS — K8689 Other specified diseases of pancreas: Secondary | ICD-10-CM

## 2016-01-27 DIAGNOSIS — K869 Disease of pancreas, unspecified: Secondary | ICD-10-CM | POA: Diagnosis present

## 2016-01-27 HISTORY — DX: Gastro-esophageal reflux disease without esophagitis: K21.9

## 2016-01-27 HISTORY — DX: Erythematous condition, unspecified: L53.9

## 2016-01-27 HISTORY — PX: EUS: SHX5427

## 2016-01-27 HISTORY — DX: Unspecified osteoarthritis, unspecified site: M19.90

## 2016-01-27 LAB — GLUCOSE, CAPILLARY: GLUCOSE-CAPILLARY: 168 mg/dL — AB (ref 65–99)

## 2016-01-27 SURGERY — ESOPHAGEAL ENDOSCOPIC ULTRASOUND (EUS) RADIAL
Anesthesia: Monitor Anesthesia Care

## 2016-01-27 MED ORDER — PROPOFOL 10 MG/ML IV BOLUS
INTRAVENOUS | Status: AC
  Filled 2016-01-27: qty 20

## 2016-01-27 MED ORDER — LIDOCAINE HCL (CARDIAC) 20 MG/ML IV SOLN
INTRAVENOUS | Status: AC
  Filled 2016-01-27: qty 5

## 2016-01-27 MED ORDER — LIDOCAINE HCL (CARDIAC) 20 MG/ML IV SOLN
INTRAVENOUS | Status: DC | PRN
  Administered 2016-01-27: 75 mg via INTRAVENOUS

## 2016-01-27 MED ORDER — LABETALOL HCL 5 MG/ML IV SOLN
INTRAVENOUS | Status: DC | PRN
  Administered 2016-01-27: 5 mg via INTRAVENOUS

## 2016-01-27 MED ORDER — GLYCOPYRROLATE 0.2 MG/ML IJ SOLN
INTRAMUSCULAR | Status: DC | PRN
  Administered 2016-01-27: 0.2 mg via INTRAVENOUS

## 2016-01-27 MED ORDER — SODIUM CHLORIDE 0.9 % IV SOLN: INTRAVENOUS | Status: DC

## 2016-01-27 MED ORDER — GLYCOPYRROLATE 0.2 MG/ML IJ SOLN
INTRAMUSCULAR | Status: AC
  Filled 2016-01-27: qty 1

## 2016-01-27 MED ORDER — PROPOFOL 10 MG/ML IV BOLUS
INTRAVENOUS | Status: AC
  Filled 2016-01-27: qty 40

## 2016-01-27 MED ORDER — PROPOFOL 500 MG/50ML IV EMUL
INTRAVENOUS | Status: DC | PRN
  Administered 2016-01-27: 300 ug/kg/min via INTRAVENOUS

## 2016-01-27 MED ORDER — LACTATED RINGERS IV SOLN
INTRAVENOUS | Status: DC
  Administered 2016-01-27: 1000 mL via INTRAVENOUS

## 2016-01-27 MED ORDER — LABETALOL HCL 5 MG/ML IV SOLN
INTRAVENOUS | Status: AC
  Filled 2016-01-27: qty 4

## 2016-01-27 NOTE — Interval H&P Note (Signed)
History and Physical Interval Note:  01/27/2016 7:17 AM  Colleen Riley  has presented today for surgery, with the diagnosis of pancreatic mass  The various methods of treatment have been discussed with the patient and family. After consideration of risks, benefits and other options for treatment, the patient has consented to  Procedure(s): ESOPHAGEAL ENDOSCOPIC ULTRASOUND (EUS) RADIAL (N/A) as a surgical intervention .  The patient's history has been reviewed, patient examined, no change in status, stable for surgery.  I have reviewed the patient's chart and labs.  Questions were answered to the patient's satisfaction.     Milus Banister

## 2016-01-27 NOTE — Transfer of Care (Signed)
Immediate Anesthesia Transfer of Care Note  Patient: Colleen Riley  Procedure(s) Performed: Procedure(s): ESOPHAGEAL ENDOSCOPIC ULTRASOUND (EUS) RADIAL (N/A)  Patient Location: PACU  Anesthesia Type:MAC  Level of Consciousness: awake, alert , oriented and patient cooperative  Airway & Oxygen Therapy: Patient Spontanous Breathing and Patient connected to nasal cannula oxygen  Post-op Assessment: Report given to RN, Post -op Vital signs reviewed and stable and Patient moving all extremities X 4  Post vital signs: stable  Last Vitals:  Vitals:   01/27/16 0732 01/27/16 0859  BP: (!) 143/85   Pulse: 69 83  Resp: 10 15  Temp: 36.9 C     Last Pain:  Vitals:   01/27/16 0732  TempSrc: Oral         Complications: No apparent anesthesia complications  BP UP labetolol given in pacu

## 2016-01-27 NOTE — Discharge Instructions (Signed)

## 2016-01-27 NOTE — H&P (View-Only) (Signed)
[name], MD [place] / [place] [address]   DIAGNOSIS: Pancreatic Mass  SUMMARY OF ONCOLOGIC HISTORY:   Pancreatic cancer ([place])   08/24/2015 Imaging    CT abdomen/pelvis with contrast, with no evidence acute intra abdominal or intrapelvic abnormality, no CT evicdence pancreatitis. no pancreatic mass or pancreatic ductal dilatation senn. Colonic diverticulosis     09/08/2015 Imaging    MRI abdomen [place] with mild increase caliber of common bile duct status post cholecystecomy. Focal narrowing within the dital common bile duct may represent stricture.      12/08/2015 [facility], 3 x 2.7 x 3 cm hypoechoic ill defined focus in the pancreatic head, pancreatic duct dilatation of 4 mm noted.      12/30/2015 PET scan    Marked hypermetabolic pancreatic head mass with hypermetabolic retroperitoneal LAD, hypermetabolic RUL pulmonary nodule, could be a metastatic lesion or primary bronchogenic neoplasm. appears to be hypermetabolic LAD in the R hilum      CURRENT THERAPY: NONE  INTERVAL HISTORY: Colleen Riley 82 y.o. female returns for follow-up of a pancreatic mass. The patient was originally seen in consultation on 12/22/2015 by Dr. Irene Limbo, at this point the details of that visit are unavailable to me. She presents today after having undergone a PET/CT at [facility] on 7/20.  She apparently has been having abdominal pain off/on for many months. She has undergone multiple modes of imaging including MR/CT and ultrasound dating back to March of this year. She is also reported to have had an elevated lipase. She is able to eat although she notes some weight loss. Denies vomiting. She still lives independently.  She presents today with multiple family members. (6 total)  They all note that the patient does not want to have a scope of any kind. They had multiple questions about surgery. She may be interested in chemotherapy.    MEDICAL HISTORY: Past  Medical History:  Diagnosis Date  . Arthritis   . Depression   . Diabetes mellitus without complication ([place])   . GERD (gastroesophageal reflux disease)   . Hypertension   . Redness    both inner thighs comes and goes    SURGICAL HISTORY: Past Surgical History:  Procedure Laterality Date  . CHOLECYSTECTOMY    . JOINT REPLACEMENT     right hip replacement  . JOINT REPLACEMENT     right knee replacement    SOCIAL HISTORY: Social History   Social History  . Marital status: Widowed    Spouse name: N/A  . Number of children: N/A  . Years of education: N/A   Occupational History  . Not on file.   Social History Main Topics  . Smoking status: Never Smoker  . Smokeless tobacco: Never Used  . Alcohol use No  . Drug use: No  . Sexual activity: No   Other Topics Concern  . Not on file   Social History Narrative  . No narrative on file  Patient lives alone, multiple children. Strong support group.  FAMILY HISTORY: Family History  Problem Relation Age of Onset  . Heart failure Mother   . Diabetes Father   . Cancer Sister     Review of Systems  Constitutional: Positive for malaise/fatigue.  HENT: Negative.   Eyes: Negative.   Respiratory: Negative.   Cardiovascular: Negative.   Gastrointestinal: Positive for abdominal pain and nausea.  Genitourinary: Negative.   Musculoskeletal: Positive for joint pain.  Skin:  Negative.   Neurological: Positive for weakness.  Endo/Heme/Allergies: Negative.   Psychiatric/Behavioral: Negative.     PHYSICAL EXAMINATION  ECOG PERFORMANCE STATUS: 1 - Symptomatic but completely ambulatory  Vitals:   01/06/16 0800  BP: 123/64  Pulse: 74  Resp: 16  Temp: 98.7 F (37.1 C)    Physical Exam  Constitutional: She is oriented to person, place, and time and well-developed, well-nourished, and in no distress.  In wheelchair  HENT:  Head: Normocephalic and atraumatic.  Nose: Nose normal.  Mouth/Throat: Oropharynx is clear and  moist. No oropharyngeal exudate.  Eyes: Conjunctivae and EOM are normal. Pupils are equal, round, and reactive to light. Right eye exhibits no discharge. Left eye exhibits no discharge. No scleral icterus.  Neck: Normal range of motion. Neck supple. No tracheal deviation present. No thyromegaly present.  Cardiovascular: Normal rate, regular rhythm and normal heart sounds.  Exam reveals no gallop and no friction rub.   No murmur heard. Pulmonary/Chest: Effort normal and breath sounds normal. She has no wheezes. She has no rales.  Abdominal: Soft. Bowel sounds are normal. She exhibits no distension and no mass. There is tenderness. There is guarding. There is no rebound.  Musculoskeletal: Normal range of motion. She exhibits no edema.  Lymphadenopathy:    She has no cervical adenopathy.  Neurological: She is alert and oriented to person, place, and time. No cranial nerve deficit. Coordination normal.  Skin: Skin is warm and dry. No rash noted.  Psychiatric: Mood, memory, affect and judgment normal.  Nursing note and vitals reviewed.   LABORATORY DATA: I have reviewed the data as listed. CBC    Component Value Date/Time   WBC 6.9 01/06/2016 0801   RBC 3.91 01/06/2016 0801   HGB 11.9 (L) 01/06/2016 0801   HCT 35.1 (L) 01/06/2016 0801   PLT 282 01/06/2016 0801   MCV 89.8 01/06/2016 0801   MCH 30.4 01/06/2016 0801   MCHC 33.9 01/06/2016 0801   RDW 13.5 01/06/2016 0801   LYMPHSABS 2.3 01/06/2016 0801   MONOABS 0.4 01/06/2016 0801   EOSABS 0.1 01/06/2016 0801   BASOSABS 0.0 01/06/2016 0801    CMP     Component Value Date/Time   NA 134 (L) 01/06/2016 0801   K 3.7 01/06/2016 0801   CL 105 01/06/2016 0801   CO2 25 01/06/2016 0801   GLUCOSE 192 (H) 01/06/2016 0801   BUN 8 01/06/2016 0801   CREATININE 0.68 01/06/2016 0801   CALCIUM 9.1 01/06/2016 0801   PROT 7.2 01/06/2016 0801   ALBUMIN 3.7 01/06/2016 0801   AST 23 01/06/2016 0801   ALT 26 01/06/2016 0801   ALKPHOS 80  01/06/2016 0801   BILITOT 0.7 01/06/2016 0801   GFRNONAA >60 01/06/2016 0801   GFRAA >60 01/06/2016 0801       RADIOGRAPHIC STUDIES: I have personally reviewed the radiological images as listed and agreed with the findings in the report. Study Result   CLINICAL DATA:  Initial treatment strategy for pancreatic cancer.  EXAM: NUCLEAR MEDICINE PET SKULL BASE TO THIGH  TECHNIQUE: 9.9 mCi F-18 FDG was injected intravenously. Full-ring PET imaging was performed from the skull base to thigh after the radiotracer. CT data was obtained and used for attenuation correction and anatomic localization.  FASTING BLOOD GLUCOSE:  Value: 170 mg/dl  COMPARISON:  None.  FINDINGS: NECK  No hypermetabolic lymph nodes in the neck.  CHEST  11 mm right upper lobe pulmonary nodule is hypermetabolic with SUV max = 9.4. Hypermetabolism in the right  hilum is suspicious for metastatic lymphadenopathy although a discrete lymph node cannot be identified on the CT scan performed without intravenous contrast material.  Lung windows show emphysema. Scattered small bilateral pulmonary nodules are below the threshold for reliable resolution on PET imaging.  ABDOMEN/PELVIS  Lesion in the head of the pancreas measures approximately 4.1 cm. Hypermetabolism in this lesion demonstrates SUV max = 8.0. Small left para-aortic lymph node (Image 102 series 4) is hypermetabolic with SUV max = 4.9 and abnormal soft tissue between the SMA in the aorta (image 105 series 4) is hypermetabolic with SUV max = 7.3.  Gallbladder surgically absent. Right renal cysts noted. Small volume intraperitoneal free fluid.  SKELETON  Patient is status post right hip replacement.  IMPRESSION: 1. Markedly hypermetabolic pancreatic head mass with hypermetabolic retroperitoneal lymphadenopathy. 2. Hypermetabolic right upper lobe pulmonary nodule. This could be a metastatic lesion or primary bronchogenic  neoplasm. There appears to be hypermetabolic lymphadenopathy in the right hilum although this is not clearly discernible on uninfused CT imaging. 3. Emphysema 4. Abdominal aortic atherosclerosis.   Electronically Signed   By: Misty Stanley M.D.   On: 12/30/2015 14:36    ASSESSMENT and THERAPY PLAN:  Pancreatic head mass RUL pulmonary nodule Abdominal pain Elevated CA 19-9 hyponatremia  I spoke with the family in detail that at this point clinical scenario is certainly c/w pancreatic carcinoma. CA 19-9 is markedly elevated. Given retroperitoneal LAD on PET/CT I suspect stage IV disease. The exact nature of the pulmonary nodule at this point is uncertain but I do not think pursuing biopsy at this point would be necessary.  Given her age and PET findings I do not feel she would be a surgical candidate but if they desire surgical consultation I would be glad to refer them to Dr. Crisoforo Oxford or Dr. Eugenia Pancoast at [facility]  I advised them that If she opted to pursue therapy she is a reasonable candidate for treatment however, it is considered standard of care to biopsy prior to treating. I would recommend referral to [place] to Dr. Ardis Hughs for EUS and biopsy.  At this point they are not certain if they want to proceed with biopsy, they have met our patient navigator today and I advised them to call if they wish to be referred for biopsy. If they wish for surgical referral to also let me know. I am glad to assist however they decide.  All questions were answered. The patient knows to call the clinic with any problems, questions or concerns. We can certainly see the patient much sooner if necessary.  This document serves as a record of services personally performed by Ancil Linsey, MD. It was created on her behalf by Arlyce Harman, a trained medical scribe. The creation of this record is based on the scribe's personal observations and the provider's statements to them. This document has been checked and  approved by the attending provider.  I have reviewed the above documentation for accuracy and completeness, and I agree with the above.  This note was electronically signed. Molli Hazard, MD 01/25/2016

## 2016-01-27 NOTE — Op Note (Signed)
[facility] Patient Name: Colleen Riley Procedure Date: 01/27/2016 MRN: [id number] Attending MD: Milus Banister , MD Date of Birth: [birth year] CSN: [id number] Age: 82 Admit Type: Outpatient Procedure:                Upper EUS Indications:              Abnormal abdominal PET scan (PET avid mass in head                            of pancreas) Providers:                Milus Banister, MD, Cleda Daub, RN, Alfonso Patten, Technician, Enrigue Catena, CRNA Referring MD:             Ancil Linsey, MD Medicines:                Monitored Anesthesia Care Complications:            No immediate complications. Estimated blood loss:                            None. Estimated Blood Loss:     Estimated blood loss: none. Procedure:                Pre-Anesthesia Assessment:                           - Prior to the procedure, a History and Physical                            was performed, and patient medications and                            allergies were reviewed. The patient's tolerance of                            previous anesthesia was also reviewed. The risks                            and benefits of the procedure and the sedation                            options and risks were discussed with the patient.                            All questions were answered, and informed consent                            was obtained. Prior Anticoagulants: The patient has                            taken no previous anticoagulant or antiplatelet  agents. ASA Grade Assessment: II - A patient with                            mild systemic disease. After reviewing the risks                            and benefits, the patient was deemed in                            satisfactory condition to undergo the procedure.                           After obtaining informed consent, the endoscope was                            passed under direct  vision. Throughout the                            procedure, the patient's blood pressure, pulse, and                            oxygen saturations were monitored continuously. The                            [id number]) scope was introduced through                            the mouth, and advanced to the second part of                            duodenum. The [id number]) scope was                            introduced through the mouth, and advanced to the                            second part of duodenum. The upper EUS was                            accomplished without difficulty. The patient                            tolerated the procedure well. Scope In: Scope Out: Findings:      Endoscopic Finding :      The examined esophagus was endoscopically normal.      The entire examined stomach was endoscopically normal.      The examined duodenum was endoscopically normal.      Endosonographic Finding :      1. A round mass was identified in the pancreatic head. The mass was       hypoechoic and heterogenous. The mass measured 45 mm in maximal       cross-sectional diameter. The endosonographic borders were       poorly-defined. rThe mass appears to invade the portal vein and involves  the SMV at the level of the confluence. The mass also abuts the SMA,       suggesting invasion. The remainder of the pancreas was examined. The       endosonographic appearance of parenchyma and the upstream pancreatic       duct indicated duct dilation and a maximum duct diameter of 6 mm. Fine       needle aspiration for cytology was performed. Color Doppler imaging was       utilized prior to needle puncture to confirm a lack of significant       vascular structures within the needle path. One pass was made with the       25 gauge needle using a transduodenal approach. No stylet was used. A       cytotechnologist was present to evaluate the adequacy of the specimen.        Preliminary cytology is suspicious for adenocarcinoma (final results are       pending).      2. Trace perihepatic free fluid, not sampled      3. CBD was slightly dilated, into the mass (7-47mm diameter) Impression:               - 4.5cm mass in the head of pancreas with likely                            invasion of portal vein, SMV, SMA. The mass was                            sampled with FNA and preliminary cytology is                            positive for malignancy (adenocarcinoma).                           - Await final cytology report. Moderate Sedation:      N/A- Per Anesthesia Care Recommendation:           - Discharge patient to home (ambulatory).                           - Resume previous diet.                           - Continue present medications.                           - Follow up at [facility] as                            previously scheduled. Procedure Code(s):        --- Professional ---                           [phone], Esophagogastroduodenoscopy, flexible,                            transoral; with transendoscopic ultrasound-guided  intramural or transmural fine needle                            aspiration/biopsy(s), (includes endoscopic                            ultrasound examination limited to the esophagus,                            stomach or duodenum, and adjacent structures) Diagnosis Code(s):        --- Professional ---                           K86.89, Other specified diseases of pancreas                           R93.5, Abnormal findings on diagnostic imaging of                            other abdominal regions, including retroperitoneum CPT copyright 2016 [employer]. All rights reserved. The codes documented in this report are preliminary and upon coder review may  be revised to meet current compliance requirements. Milus Banister, MD 01/27/2016 9:01:18 AM This report has been signed  electronically. Number of Addenda: 0

## 2016-01-28 ENCOUNTER — Encounter (HOSPITAL_COMMUNITY): Payer: Self-pay | Admitting: Gastroenterology

## 2016-01-28 NOTE — Anesthesia Postprocedure Evaluation (Signed)
Anesthesia Post Note  Patient: Colleen Riley  Procedure(s) Performed: Procedure(s) (LRB): ESOPHAGEAL ENDOSCOPIC ULTRASOUND (EUS) RADIAL (N/A)  Patient location during evaluation: PACU Anesthesia Type: MAC Level of consciousness: awake and alert Pain management: pain level controlled Vital Signs Assessment: post-procedure vital signs reviewed and stable Respiratory status: spontaneous breathing, nonlabored ventilation, respiratory function stable and patient connected to nasal cannula oxygen Cardiovascular status: stable and blood pressure returned to baseline Anesthetic complications: no    Last Vitals:  Vitals:   01/27/16 0935 01/27/16 0937  BP: (!) 171/46 (!) 186/62  Pulse: 72 68  Resp: 16 16  Temp:      Last Pain:  Vitals:   01/27/16 0935  TempSrc:   PainSc: [place]

## 2016-01-31 ENCOUNTER — Encounter (HOSPITAL_COMMUNITY): Payer: Self-pay | Admitting: Hematology & Oncology

## 2016-02-04 NOTE — Progress Notes (Signed)
Colleen Burly, MD [place] / [place] [id number]   DIAGNOSIS: Pancreatic Mass  SUMMARY OF ONCOLOGIC HISTORY:   Pancreatic cancer ([place])   08/24/2015 Imaging    CT abdomen/pelvis with contrast, with no evidence acute intra abdominal or intrapelvic abnormality, no CT evicdence pancreatitis. no pancreatic mass or pancreatic ductal dilatation senn. Colonic diverticulosis      09/08/2015 Imaging    MRI abdomen [place] with mild increase caliber of common bile duct status post cholecystecomy. Focal narrowing within the dital common bile duct may represent stricture.       12/08/2015 [facility], 3 x 2.7 x 3 cm hypoechoic ill defined focus in the pancreatic head, pancreatic duct dilatation of 4 mm noted.       12/30/2015 PET scan    Marked hypermetabolic pancreatic head mass with hypermetabolic retroperitoneal LAD, hypermetabolic RUL pulmonary nodule, could be a metastatic lesion or primary bronchogenic neoplasm. appears to be hypermetabolic LAD in the R hilum      01/27/2016 Procedure    Upper EUSround mass was identified in the pancreatic head. The mass was hypoechoic and heterogenous. Themass measured 45 mm in maximal cross-sectional diameter. The endosonographic borders were poorly defined.The mass appears to invade the portal vein and involves the SMV at the level of the confluence.The mass also abuts the SMA, suggesting invasion       01/27/2016 Pathology Results    FINE NEEDLE ASPIRATION, PANCREAS, HEAD(SPECIMEN 1 OF 1 COLLECTED 01/27/16): MALIGNANT CELLS CONSISTENT WITH ADENOCARCINOMA        CURRENT THERAPY: NONE  INTERVAL HISTORY: Colleen Riley 82 y.o. female returns for follow-up of pancreatic carcinoma. She is here today with her 2 daughter, son and daugher in [employer]. Family is very realistic. Patient is also realistic. She however wants to consider therapy and notes I don't want to go directly to hospice. Her son has concerns about  chemotherapy, noting he wants quality and comfort.   She is weak. She still lives alone. Family does come in to help frequently. She gets around some, but is doing so less and less. Appetite is marginal at best. Weight is reasonably stable.   Pain is ok. She continues on the fentanyl patch. They would like refills. She may take 1 to 2 pain pills daily. At times she may require three.   MEDICAL HISTORY: Past Medical History:  Diagnosis Date  . Arthritis   . Depression   . Diabetes mellitus without complication ([place])   . GERD (gastroesophageal reflux disease)   . Hypertension   . Redness    both inner thighs comes and goes    SURGICAL HISTORY: Past Surgical History:  Procedure Laterality Date  . CHOLECYSTECTOMY    . EUS N/A 01/27/2016   Procedure: ESOPHAGEAL ENDOSCOPIC ULTRASOUND (EUS) RADIAL;  Surgeon: Milus Banister, MD;  Location: [facility];  Service: Endoscopy;  Laterality: N/A;  . JOINT REPLACEMENT     right hip replacement  . JOINT REPLACEMENT     right knee replacement    SOCIAL HISTORY: Social History   Social History  . Marital status: Widowed    Spouse name: N/A  . Number of children: N/A  . Years of education: N/A   Occupational History  . Not on file.   Social History Main Topics  . Smoking status: Never Smoker  . Smokeless tobacco: Never Used  . Alcohol use No  . Drug use: No  . Sexual activity:  No   Other Topics Concern  . Not on file   Social History Narrative  . No narrative on file  Patient lives alone, multiple children. Strong support group.  FAMILY HISTORY: Family History  Problem Relation Age of Onset  . Heart failure Mother   . Diabetes Father   . Cancer Sister     Review of Systems  Constitutional: Positive for malaise/fatigue.  HENT: Negative.   Eyes: Negative.   Respiratory: Negative.   Cardiovascular: Negative.   Gastrointestinal: Positive for abdominal pain and nausea.  Genitourinary: Negative.   Musculoskeletal:  Positive for joint pain.  Skin: Negative.   Neurological: Positive for weakness.  Endo/Heme/Allergies: Negative.   Psychiatric/Behavioral: Negative.   14 point review of systems was performed and is negative except as detailed under history of present illness and above   PHYSICAL EXAMINATION  ECOG PERFORMANCE STATUS: 1 - Symptomatic but completely ambulatory  Vitals:   02/07/16 0952  BP: (!) 142/54  Pulse: 64  Resp: 16  Temp: 98.2 F (36.8 C)    Physical Exam  Constitutional: She is oriented to person, place, and time and well-developed, well-nourished, and in no distress.  In Wheelchair, well groomed  Neurological: She is alert and oriented to person, place, and time.  Psychiatric: Mood, memory, affect and judgment normal.    LABORATORY DATA: I have reviewed the data as listed. CBC    Component Value Date/Time   WBC 6.9 01/06/2016 0801   RBC 3.91 01/06/2016 0801   HGB 11.9 (L) 01/06/2016 0801   HCT 35.1 (L) 01/06/2016 0801   PLT 282 01/06/2016 0801   MCV 89.8 01/06/2016 0801   MCH 30.4 01/06/2016 0801   MCHC 33.9 01/06/2016 0801   RDW 13.5 01/06/2016 0801   LYMPHSABS 2.3 01/06/2016 0801   MONOABS 0.4 01/06/2016 0801   EOSABS 0.1 01/06/2016 0801   BASOSABS 0.0 01/06/2016 0801    CMP     Component Value Date/Time   NA 134 (L) 01/06/2016 0801   K 3.7 01/06/2016 0801   CL 105 01/06/2016 0801   CO2 25 01/06/2016 0801   GLUCOSE 192 (H) 01/06/2016 0801   BUN 8 01/06/2016 0801   CREATININE 0.68 01/06/2016 0801   CALCIUM 9.1 01/06/2016 0801   PROT 7.2 01/06/2016 0801   ALBUMIN 3.7 01/06/2016 0801   AST 23 01/06/2016 0801   ALT 26 01/06/2016 0801   ALKPHOS 80 01/06/2016 0801   BILITOT 0.7 01/06/2016 0801   GFRNONAA >60 01/06/2016 0801   GFRAA >60 01/06/2016 0801      PATHOLOGY:    RADIOGRAPHIC STUDIES: I have personally reviewed the radiological images as listed and agreed with the findings in the report. Study Result   CLINICAL DATA:  Initial  treatment strategy for pancreatic cancer.  EXAM: NUCLEAR MEDICINE PET SKULL BASE TO THIGH  TECHNIQUE: 9.9 mCi F-18 FDG was injected intravenously. Full-ring PET imaging was performed from the skull base to thigh after the radiotracer. CT data was obtained and used for attenuation correction and anatomic localization.  FASTING BLOOD GLUCOSE:  Value: 170 mg/dl  COMPARISON:  None.  FINDINGS: NECK  No hypermetabolic lymph nodes in the neck.  CHEST  11 mm right upper lobe pulmonary nodule is hypermetabolic with SUV max = 9.4. Hypermetabolism in the right hilum is suspicious for metastatic lymphadenopathy although a discrete lymph node cannot be identified on the CT scan performed without intravenous contrast material.  Lung windows show emphysema. Scattered small bilateral pulmonary nodules are below the threshold  for reliable resolution on PET imaging.  ABDOMEN/PELVIS  Lesion in the head of the pancreas measures approximately 4.1 cm. Hypermetabolism in this lesion demonstrates SUV max = 8.0. Small left para-aortic lymph node (Image 102 series 4) is hypermetabolic with SUV max = 4.9 and abnormal soft tissue between the SMA in the aorta (image 105 series 4) is hypermetabolic with SUV max = 7.3.  Gallbladder surgically absent. Right renal cysts noted. Small volume intraperitoneal free fluid.  SKELETON  Patient is status post right hip replacement.  IMPRESSION: 1. Markedly hypermetabolic pancreatic head mass with hypermetabolic retroperitoneal lymphadenopathy. 2. Hypermetabolic right upper lobe pulmonary nodule. This could be a metastatic lesion or primary bronchogenic neoplasm. There appears to be hypermetabolic lymphadenopathy in the right hilum although this is not clearly discernible on uninfused CT imaging. 3. Emphysema 4. Abdominal aortic atherosclerosis.   Electronically Signed   By: Misty Stanley M.D.   On: 12/30/2015 14:36    ASSESSMENT  and THERAPY PLAN:  Stage IV Pancreatic Carcinoma EUS with Dr. Ardis Hughs on 01/27/2016 RUL pulmonary nodule Retroperitoneal adenopathy Abdominal pain Elevated CA 19-9 Hyponatremia PS 2  We had a very candid discussion today about the patient and pancreatic carcinoma, incurability of disease and goals of care. Everyone agrees that the goals of care are quality time and comfort.  The patient would like to try therapy. Her son is very reluctant. We discussed Gemzar/abraxane. I do feel she may be able to tolerate treatment. We set clear goals that if treatment worsens her PS that we would stop therapy.   We discussed starting with a PICC line and transitioning to a port if she is doing well.  We discussed hospice in detail. Family had a lot of questions regarding hospice. All questions were answered.   At the end of our conversation family and patient opted to try therapy, she will be set up for PICC, chemotherapy teaching and chemotherapy later this week.  A total of 40 minutes was spent in direct patient consultation and advisement.   All questions were answered. The patient knows to call the clinic with any problems, questions or concerns. We can certainly see the patient much sooner if necessary.  This note was electronically signed. Molli Hazard, MD 02/07/2016

## 2016-02-07 ENCOUNTER — Encounter (HOSPITAL_COMMUNITY): Payer: Self-pay | Admitting: Hematology & Oncology

## 2016-02-07 ENCOUNTER — Encounter (HOSPITAL_COMMUNITY): Payer: Medicare Other | Attending: Hematology | Admitting: Hematology & Oncology

## 2016-02-07 VITALS — BP 142/54 | HR 64 | Temp 98.2°F | Resp 16 | Wt 191.4 lb

## 2016-02-07 DIAGNOSIS — R918 Other nonspecific abnormal finding of lung field: Secondary | ICD-10-CM | POA: Diagnosis not present

## 2016-02-07 DIAGNOSIS — E871 Hypo-osmolality and hyponatremia: Secondary | ICD-10-CM | POA: Diagnosis not present

## 2016-02-07 DIAGNOSIS — R109 Unspecified abdominal pain: Secondary | ICD-10-CM | POA: Diagnosis not present

## 2016-02-07 DIAGNOSIS — C250 Malignant neoplasm of head of pancreas: Secondary | ICD-10-CM | POA: Diagnosis not present

## 2016-02-07 DIAGNOSIS — R599 Enlarged lymph nodes, unspecified: Secondary | ICD-10-CM | POA: Diagnosis not present

## 2016-02-07 DIAGNOSIS — R970 Elevated carcinoembryonic antigen [CEA]: Secondary | ICD-10-CM | POA: Diagnosis not present

## 2016-02-07 DIAGNOSIS — G893 Neoplasm related pain (acute) (chronic): Secondary | ICD-10-CM

## 2016-02-07 DIAGNOSIS — R1013 Epigastric pain: Secondary | ICD-10-CM

## 2016-02-07 MED ORDER — PROCHLORPERAZINE MALEATE 10 MG PO TABS: 10.0000 mg | ORAL_TABLET | Freq: Four times a day (QID) | ORAL | 2 refills | Status: AC | PRN

## 2016-02-07 MED ORDER — FENTANYL 50 MCG/HR TD PT72: 50.0000 ug | MEDICATED_PATCH | TRANSDERMAL | 0 refills | Status: DC

## 2016-02-07 NOTE — Patient Instructions (Addendum)
[place] at [facility] Discharge Instructions  RECOMMENDATIONS MADE BY THE CONSULTANT AND ANY TEST RESULTS WILL BE SENT TO YOUR REFERRING PHYSICIAN.  You saw Dr. Whitney Muse today. You will be scheduled for treatment later this week. Prior to treatment you will have a PICC line placed and chemo teaching.  Follow up 1 week after day 8 therapy.   Thank you for choosing [place] at [facility] to provide your oncology and hematology care.  To afford each patient quality time with our provider, please arrive at least 15 minutes before your scheduled appointment time.   Beginning January 23rd 2017 lab work for the [facility] will be done in the  Main lab at [facility] on 1st floor. If you have a lab appointment with the [place] please come in thru the  Main Entrance and check in at the main information desk  You need to re-schedule your appointment should you arrive 10 or more minutes late.  We strive to give you quality time with our providers, and arriving late affects you and other patients whose appointments are after yours.  Also, if you no show three or more times for appointments you may be dismissed from the clinic at the providers discretion.     Again, thank you for choosing [facility].  Our hope is that these requests will decrease the amount of time that you wait before being seen by our physicians.       _____________________________________________________________  Should you have questions after your visit to [facility], please contact our office at [phone] between the hours of 8:30 a.m. and 4:30 p.m.  Voicemails left after 4:30 p.m. will not be returned until the following business day.  For prescription refill requests, have your pharmacy contact our office.         Resources For Cancer Patients and their Caregivers ? American Cancer Society: Can assist with transportation, wigs,  general needs, runs Look Good Feel Better.        704 801 9751 ? Cancer Care: Provides financial assistance, online support groups, medication/co-pay assistance.  1-800-813-HOPE 402-760-3371) ? [place] Assists [place] cancer patients and their families through emotional , educational and financial support.  351-809-6457 ? [place] DSS Where to apply for food stamps, Medicaid and utility assistance. 952 438 9530 ? [facility]: Transportation to medical appointments. 3515297225 ? Social Security Administration: May apply for disability if have a Stage IV cancer. 2094861115 4035491594 ? [employer], Disability and Transit Services: Assists with nutrition, care and transit needs. [place] Support Programs: @10RELATIVEDAYS @ > Cancer Support Group  2nd Tuesday of the month 1pm-2pm, Journey Room  > Creative Journey  3rd Tuesday of the month 1130am-1pm, Journey Room  > Look Good Feel Better  1st Wednesday of the month 10am-12 noon, Journey Room (Call American Cancer Society to register [phone])   PICC Insertion A peripherally inserted central catheter (PICC) is a long, thin, flexible tube that is inserted into a vein in the upper arm. It is a form of IV access. It is considered to be a "central" line because the tip of the PICC ends in a large vein in your chest. This large vein is called the superior vena cava (SVC). The PICC tip ends in the SVC because there is a lot of blood flow in the SVC. This allows medicines and IV fluids to be quickly distributed throughout the body. The  PICC is inserted using a sterile technique by a specially trained health care provider. After the PICC is inserted, a chest X-ray may be done to ensure that it is in the correct place.  LET [facility] CARE PROVIDER KNOW ABOUT:   Any allergies you have.  All medicines you are taking, including vitamins, herbs, eye drops, creams, and over-the-counter  medicines.  Previous problems you or members of your family have had with the use of anesthetics.  Any blood disorders you have.  Previous surgeries you have had.  Medical conditions you have. RISKS AND COMPLICATIONS Generally, this is a safe procedure. However, as with any procedure, complications can occur. Possible complications include:  Infection at the insertion site or in the blood.  Bleeding at the insertion site or internally.  Injury or collapse of the lung.  Movement or malposition of the PICC.  Inflammation of the vein (phlebitis).  Nerve injury or irritation.  Clot formation at the tip of the PICC line.  Blood clot in the lung (pulmonary embolus).  Injury to the large blood vessels or heart (rare). BEFORE THE PROCEDURE   Your health care provider may want you to have blood tests. These tests can help tell how well your blood clots.  If you take blood thinners (anticoagulant medicine), ask your health care provider if you should stop taking them. PROCEDURE   You will have to lie flat for about 30-45 minutes for this procedure.  Your health care provider will start by identifying a vein into which the PICC line will be placed. This is done using ultrasound or X-ray guidance.  Medicine is used to numb the skin around the insertion site.  The skin where the catheter will be inserted is cleaned and covered with a sterile surgical drape.  A small needle is inserted into the vein, and then a small guidewire is advanced into the superior vena cava.  The catheter is then advanced over the guidewire and moved into position. The guidewire is then removed.  The catheter is flushed and blood is drawn back to confirm it is in the vein.  If this is done without X-ray guidance, an X-ray will be needed to make sure the catheter tip is in the correct position.  Once the placement is confirmed, the PICC is secured to the skin with a device and covered with a sterile  dressing. AFTER THE PROCEDURE  You should avoid any strenuous activity for the next 2 days or as directed by your health care provider.  You will be allowed to go back to your regular activities after the procedure.  You will be instructed on the care of your PICC line.   This information is not intended to replace advice given to you by your health care provider. Make sure you discuss any questions you have with your health care provider.   Document Released: 03/19/2013 Document Revised: 06/03/2013 Document Reviewed: 03/19/2013 Elsevier Interactive Patient Education [employer].

## 2016-02-07 NOTE — Patient Instructions (Addendum)
Colleen Riley   CHEMOTHERAPY INSTRUCTIONS  Premeds:  Zofran - for nausea/vomiting prevention/reduction. Dexamethasone - steroid - given to reduce the risk of you having an allergic type reaction to the chemotherapy. Dex can cause you to feel energized, nervous/anxious/jittery, make you have trouble sleeping, and/or make you feel hot/flushed in the face/neck and/or look pink/red in the face/neck. These side effects will pass as the Dex wears off. (takes 20 minutes to infuse) Premeds may change to something a little stronger for nausea prevention.   Chemo:  Day 1, 8, 15 every 28 days. Labs will be drawn @ each chemo visit prior to chemo.  Abraxane - myelosuppression (bone marrow suppression - lowers white blood cells, red blood cells, and platelets), sensory neuropathy, muscle and joint aches/pain, nausea/vomiting, diarrhea, mucositis, hair loss )   (takes 30 minutes to infuse)  Gemcitabine - bone marrow suppression (lowers white blood cells (fight infection), lowers red blood cells (make up your blood), lowers platelets (help blood to clot). Nausea/vomiting,fever, flu-like symptoms, rash. (takes 30 minutes to infuse)   POTENTIAL SIDE EFFECTS OF TREATMENT: Increased Susceptibility to Infection, Vomiting, Constipation, Hair Thinning, Changes in Character of Skin and Nails (brittleness, dryness,etc.), Pigment Changes (darkening of nail beds, palms of hands, soles of feet, etc.), Bone Marrow Suppression, Abdominal Cramping, Complete Hair Loss, Nausea, Diarrhea, Sun Sensitivity and Mouth Sores   SELF IMAGE NEEDS AND REFERRALS MADE: Obtain hair accessories as soon as possible (wigs, scarves, turbans,caps,etc.)   EDUCATIONAL MATERIALS GIVEN AND REVIEWED: Chemotherapy and You book Specific Instructions Sheets: Abraxane, Gemzar, Zofran, PICC line   SELF CARE ACTIVITIES WHILE ON CHEMOTHERAPY: Increase your fluid intake 48 hours prior to treatment and drink at least  2 quarts per day after treatment., No alcohol intake., No aspirin or other medications unless approved by your oncologist., Eat foods that are light and easy to digest., Eat foods at cold or room temperature., No fried, fatty, or spicy foods immediately before or after treatment., Have teeth cleaned professionally before starting treatment. Keep dentures and partial plates clean., Use soft toothbrush and do not use mouthwashes that contain alcohol. Biotene is a good mouthwash that is available at most pharmacies or may be ordered by calling [phone]., Use warm salt water gargles (1 teaspoon salt per 1 quart warm water) before and after meals and at bedtime. Or you may rinse with 2 tablespoons of three -percent hydrogen peroxide mixed in eight ounces of water., Always use sunscreen with SPF (Sun Protection Factor) of 30 or higher., Use your nausea medication as directed to prevent nausea., Use your stool softener or laxative as directed to prevent constipation. and Use your anti-diarrheal medication as directed to stop diarrhea.  Please wash your hands for at least 30 seconds using warm soapy water. Handwashing is the #1 way to prevent the spread of germs. Stay away from sick people or people who are getting over a cold. If you develop respiratory systems such as green/yellow mucus production or productive cough or persistent cough let us know and we will see if you need an antibiotic. It is a good idea to keep a pair of gloves on when going into grocery stores/[employer] to decrease your risk of coming into contact with germs on the carts, etc. Carry alcohol hand gel with you at all times and use it frequently if out in public. All foods need to be cooked thoroughly. No raw foods. No medium or undercooked meats, eggs. If your food is cooked medium  well, it does not need to be hot pink or saturated with bloody liquid at all. Vegetables and fruits need to be washed/rinsed under the faucet with a dish detergent  before being consumed. You can eat raw fruits and vegetables unless we tell you otherwise but it would be best if you cooked them or bought frozen. Do not eat off of salad bars or hot bars unless you really trust the cleanliness of the restaurant. If you need dental work, please let Dr. Whitney Muse know before you go for your appointment so that we can coordinate the best possible time for you in regards to your chemo regimen. You need to also let your dentist know that you are actively taking chemo. We may need to do labs prior to your dental appointment. We also want your bowels moving at least every other day. If this is not happening, we need to know so that we can get you on a bowel regimen to help you go.       MEDICATIONS: You have been given prescriptions for the following medications:   Zofran/Ondansetron 8mg  tablet. Take 1 tablet every 8 hours as needed for nausea/vomiting. (#1 nausea med to take, this can constipate)  Compazine/Prochlorperazine 10mg  tablet. Take 1 tablet every 6 hours as needed for nausea/vomiting. (#2 nausea med to take, this can make you sleepy)   Over-the-Counter Meds:  Miralax 1 capful in 8 oz of fluid daily. May increase to two times a day if needed. This is a stool softener. If this doesn't work proceed you can add:  Senokot S  - start with 1 tablet two times a day and increase to 4 tablets two times a day if needed. (total of 8 tablets in a 24 hour period). This is a stimulant laxative.   Call us if this does not help your bowels move.   Imodium 2mg  capsule. Take 2 capsules after the 1st loose stool and then 1 capsule every 2 hours until you go a total of 12 hours without having a loose stool. Call the [facility] if loose stools continue. If diarrhea occurs @ bedtime, take 2 capsules @ bedtime. Then take 2 capsules every 4 hours until morning. Call [facility].    SYMPTOMS TO REPORT AS SOON AS POSSIBLE AFTER TREATMENT:  FEVER GREATER THAN 100.5  F  CHILLS WITH OR WITHOUT FEVER  NAUSEA AND VOMITING THAT IS NOT CONTROLLED WITH YOUR NAUSEA MEDICATION  UNUSUAL SHORTNESS OF BREATH  UNUSUAL BRUISING OR BLEEDING  TENDERNESS IN MOUTH AND THROAT WITH OR WITHOUT PRESENCE OF ULCERS  URINARY PROBLEMS  BOWEL PROBLEMS  UNUSUAL RASH    Wear comfortable clothing and clothing appropriate for easy access to any Portacath or PICC line. Let us know if there is anything that we can do to make your therapy better!      I have been informed and understand all of the instructions given to me and have received a copy. I have been instructed to call the clinic [phone] or my family physician as soon as possible for continued medical care, if indicated. I do not have any more questions at this time but understand that I may call the [facility] or the Patient Navigator at [phone] during office hours should I have questions or need assistance in obtaining follow-up care.           Nanoparticle Albumin-Bound Paclitaxel injection What is this medicine? NANOPARTICLE ALBUMIN-BOUND PACLITAXEL (Na no PAHR ti kuhl al BYOO muhn-bound PAK  li TAX el) is a chemotherapy drug. It targets fast dividing cells, like cancer cells, and causes these cells to die. This medicine is used to treat advanced breast cancer and advanced lung cancer. This medicine may be used for other purposes; ask your health care provider or pharmacist if you have questions. What should I tell my health care provider before I take this medicine? They need to know if you have any of these conditions: -kidney disease -liver disease -low blood counts, like low platelets, red blood cells, or white blood cells -recent or ongoing radiation therapy -an unusual or allergic reaction to paclitaxel, albumin, other chemotherapy, other medicines, foods, dyes, or preservatives -pregnant or trying to get pregnant -breast-feeding How should I use this medicine? This drug is  given as an infusion into a vein. It is administered in a hospital or clinic by a specially trained health care professional. Talk to your pediatrician regarding the use of this medicine in children. Special care may be needed. Overdosage: If you think you have taken too much of this medicine contact a poison control center or emergency room at once. NOTE: This medicine is only for you. Do not share this medicine with others. What if I miss a dose? It is important not to miss your dose. Call your doctor or health care professional if you are unable to keep an appointment. What may interact with this medicine? -cyclosporine -diazepam -ketoconazole -medicines to increase blood counts like filgrastim, pegfilgrastim, sargramostim -other chemotherapy drugs like cisplatin, doxorubicin, epirubicin, etoposide, teniposide, vincristine -quinidine -testosterone -vaccines -verapamil Talk to your doctor or health care professional before taking any of these medicines: -acetaminophen -aspirin -ibuprofen -ketoprofen -naproxen This list may not describe all possible interactions. Give your health care provider a list of all the medicines, herbs, non-prescription drugs, or dietary supplements you use. Also tell them if you smoke, drink alcohol, or use illegal drugs. Some items may interact with your medicine. What should I watch for while using this medicine? Your condition will be monitored carefully while you are receiving this medicine. You will need important blood work done while you are taking this medicine. This drug may make you feel generally unwell. This is not uncommon, as chemotherapy can affect healthy cells as well as cancer cells. Report any side effects. Continue your course of treatment even though you feel ill unless your doctor tells you to stop. In some cases, you may be given additional medicines to help with side effects. Follow all directions for their use. Call your doctor or health  care professional for advice if you get a fever, chills or sore throat, or other symptoms of a cold or flu. Do not treat yourself. This drug decreases your body's ability to fight infections. Try to avoid being around people who are sick. This medicine may increase your risk to bruise or bleed. Call your doctor or health care professional if you notice any unusual bleeding. Be careful brushing and flossing your teeth or using a toothpick because you may get an infection or bleed more easily. If you have any dental work done, tell your dentist you are receiving this medicine. Avoid taking products that contain aspirin, acetaminophen, ibuprofen, naproxen, or ketoprofen unless instructed by your doctor. These medicines may hide a fever. Do not become pregnant while taking this medicine. Women should inform their doctor if they wish to become pregnant or think they might be pregnant. There is a potential for serious side effects to an unborn child. Talk to  your health care professional or pharmacist for more information. Do not breast-feed an infant while taking this medicine. Men are advised not to father a child while receiving this medicine. What side effects may I notice from receiving this medicine? Side effects that you should report to your doctor or health care professional as soon as possible: -allergic reactions like skin rash, itching or hives, swelling of the face, lips, or tongue -low blood counts - This drug may decrease the number of white blood cells, red blood cells and platelets. You may be at increased risk for infections and bleeding. -signs of infection - fever or chills, cough, sore throat, pain or difficulty passing urine -signs of decreased platelets or bleeding - bruising, pinpoint red spots on the skin, black, tarry stools, nosebleeds -signs of decreased red blood cells - unusually weak or tired, fainting spells, lightheadedness -breathing problems -changes in vision -chest  pain -high or low blood pressure -mouth sores -nausea and vomiting -pain, swelling, redness or irritation at the injection site -pain, tingling, numbness in the hands or feet -slow or irregular heartbeat -swelling of the ankle, feet, hands Side effects that usually do not require medical attention (report to your doctor or health care professional if they continue or are bothersome): -aches, pains -changes in the color of fingernails -diarrhea -hair loss -loss of appetite This list may not describe all possible side effects. Call your doctor for medical advice about side effects. You may report side effects to FDA at 1-800-FDA-1088. Where should I keep my medicine? This drug is given in a hospital or clinic and will not be stored at home. NOTE: This sheet is a summary. It may not cover all possible information. If you have questions about this medicine, talk to your doctor, pharmacist, or health care provider.    2016, Elsevier/Gold Standard. (2012-07-22 16:48:50) Gemcitabine injection What is this medicine? GEMCITABINE (jem SIT a been) is a chemotherapy drug. This medicine is used to treat many types of cancer like breast cancer, lung cancer, pancreatic cancer, and ovarian cancer. This medicine may be used for other purposes; ask your health care provider or pharmacist if you have questions. What should I tell my health care provider before I take this medicine? They need to know if you have any of these conditions: -blood disorders -infection -kidney disease -liver disease -recent or ongoing radiation therapy -an unusual or allergic reaction to gemcitabine, other chemotherapy, other medicines, foods, dyes, or preservatives -pregnant or trying to get pregnant -breast-feeding How should I use this medicine? This drug is given as an infusion into a vein. It is administered in a hospital or clinic by a specially trained health care professional. Talk to your pediatrician regarding  the use of this medicine in children. Special care may be needed. Overdosage: If you think you have taken too much of this medicine contact a poison control center or emergency room at once. NOTE: This medicine is only for you. Do not share this medicine with others. What if I miss a dose? It is important not to miss your dose. Call your doctor or health care professional if you are unable to keep an appointment. What may interact with this medicine? -medicines to increase blood counts like filgrastim, pegfilgrastim, sargramostim -some other chemotherapy drugs like cisplatin -vaccines Talk to your doctor or health care professional before taking any of these medicines: -acetaminophen -aspirin -ibuprofen -ketoprofen -naproxen This list may not describe all possible interactions. Give your health care provider a list of all  the medicines, herbs, non-prescription drugs, or dietary supplements you use. Also tell them if you smoke, drink alcohol, or use illegal drugs. Some items may interact with your medicine. What should I watch for while using this medicine? Visit your doctor for checks on your progress. This drug may make you feel generally unwell. This is not uncommon, as chemotherapy can affect healthy cells as well as cancer cells. Report any side effects. Continue your course of treatment even though you feel ill unless your doctor tells you to stop. In some cases, you may be given additional medicines to help with side effects. Follow all directions for their use. Call your doctor or health care professional for advice if you get a fever, chills or sore throat, or other symptoms of a cold or flu. Do not treat yourself. This drug decreases your body's ability to fight infections. Try to avoid being around people who are sick. This medicine may increase your risk to bruise or bleed. Call your doctor or health care professional if you notice any unusual bleeding. Be careful brushing and  flossing your teeth or using a toothpick because you may get an infection or bleed more easily. If you have any dental work done, tell your dentist you are receiving this medicine. Avoid taking products that contain aspirin, acetaminophen, ibuprofen, naproxen, or ketoprofen unless instructed by your doctor. These medicines may hide a fever. Women should inform their doctor if they wish to become pregnant or think they might be pregnant. There is a potential for serious side effects to an unborn child. Talk to your health care professional or pharmacist for more information. Do not breast-feed an infant while taking this medicine. What side effects may I notice from receiving this medicine? Side effects that you should report to your doctor or health care professional as soon as possible: -allergic reactions like skin rash, itching or hives, swelling of the face, lips, or tongue -low blood counts - this medicine may decrease the number of white blood cells, red blood cells and platelets. You may be at increased risk for infections and bleeding. -signs of infection - fever or chills, cough, sore throat, pain or difficulty passing urine -signs of decreased platelets or bleeding - bruising, pinpoint red spots on the skin, black, tarry stools, blood in the urine -signs of decreased red blood cells - unusually weak or tired, fainting spells, lightheadedness -breathing problems -chest pain -mouth sores -nausea and vomiting -pain, swelling, redness at site where injected -pain, tingling, numbness in the hands or feet -stomach pain -swelling of ankles, feet, hands -unusual bleeding Side effects that usually do not require medical attention (report to your doctor or health care professional if they continue or are bothersome): -constipation -diarrhea -hair loss -loss of appetite -stomach upset This list may not describe all possible side effects. Call your doctor for medical advice about side effects.  You may report side effects to FDA at 1-800-FDA-1088. Where should I keep my medicine? This drug is given in a hospital or clinic and will not be stored at home. NOTE: This sheet is a summary. It may not cover all possible information. If you have questions about this medicine, talk to your doctor, pharmacist, or health care provider.    2016, Elsevier/Gold Standard. (2007-10-08 18:45:54) PICC Insertion A peripherally inserted central catheter (PICC) is a long, thin, flexible tube that is inserted into a vein in the upper arm. It is a form of IV access. It is considered to be a "central"  line because the tip of the PICC ends in a large vein in your chest. This large vein is called the superior vena cava (SVC). The PICC tip ends in the SVC because there is a lot of blood flow in the SVC. This allows medicines and IV fluids to be quickly distributed throughout the body. The PICC is inserted using a sterile technique by a specially trained health care provider. After the PICC is inserted, a chest X-ray may be done to ensure that it is in the correct place.  LET [facility] CARE PROVIDER KNOW ABOUT:   Any allergies you have.  All medicines you are taking, including vitamins, herbs, eye drops, creams, and over-the-counter medicines.  Previous problems you or members of your family have had with the use of anesthetics.  Any blood disorders you have.  Previous surgeries you have had.  Medical conditions you have. RISKS AND COMPLICATIONS Generally, this is a safe procedure. However, as with any procedure, complications can occur. Possible complications include:  Infection at the insertion site or in the blood.  Bleeding at the insertion site or internally.  Injury or collapse of the lung.  Movement or malposition of the PICC.  Inflammation of the vein (phlebitis).  Nerve injury or irritation.  Clot formation at the tip of the PICC line.  Blood clot in the lung (pulmonary  embolus).  Injury to the large blood vessels or heart (rare). BEFORE THE PROCEDURE   Your health care provider may want you to have blood tests. These tests can help tell how well your blood clots.  If you take blood thinners (anticoagulant medicine), ask your health care provider if you should stop taking them. PROCEDURE   You will have to lie flat for about 30-45 minutes for this procedure.  Your health care provider will start by identifying a vein into which the PICC line will be placed. This is done using ultrasound or X-ray guidance.  Medicine is used to numb the skin around the insertion site.  The skin where the catheter will be inserted is cleaned and covered with a sterile surgical drape.  A small needle is inserted into the vein, and then a small guidewire is advanced into the superior vena cava.  The catheter is then advanced over the guidewire and moved into position. The guidewire is then removed.  The catheter is flushed and blood is drawn back to confirm it is in the vein.  If this is done without X-ray guidance, an X-ray will be needed to make sure the catheter tip is in the correct position.  Once the placement is confirmed, the PICC is secured to the skin with a device and covered with a sterile dressing. AFTER THE PROCEDURE  You should avoid any strenuous activity for the next 2 days or as directed by your health care provider.  You will be allowed to go back to your regular activities after the procedure.  You will be instructed on the care of your PICC line.   This information is not intended to replace advice given to you by your health care provider. Make sure you discuss any questions you have with your health care provider.   Document Released: 03/19/2013 Document Revised: 06/03/2013 Document Reviewed: 03/19/2013 Elsevier Interactive Patient Education [employer].

## 2016-02-08 ENCOUNTER — Ambulatory Visit (HOSPITAL_COMMUNITY)
Admission: RE | Admit: 2016-02-08 | Discharge: 2016-02-08 | Disposition: A | Discharge status: Home / Self Care | Payer: Medicare Other | Source: Ambulatory Visit | Attending: Hematology & Oncology | Admitting: Hematology & Oncology

## 2016-02-08 ENCOUNTER — Other Ambulatory Visit (HOSPITAL_COMMUNITY): Payer: Self-pay | Admitting: *Deleted

## 2016-02-08 ENCOUNTER — Encounter (HOSPITAL_COMMUNITY): Payer: Self-pay | Admitting: Hematology & Oncology

## 2016-02-08 ENCOUNTER — Other Ambulatory Visit (HOSPITAL_COMMUNITY): Payer: Self-pay | Admitting: Emergency Medicine

## 2016-02-08 DIAGNOSIS — C250 Malignant neoplasm of head of pancreas: Secondary | ICD-10-CM

## 2016-02-08 DIAGNOSIS — Z452 Encounter for adjustment and management of vascular access device: Secondary | ICD-10-CM | POA: Diagnosis present

## 2016-02-08 NOTE — Discharge Instructions (Signed)
PICC Home Guide A peripherally inserted central catheter (PICC) is a long, thin, flexible tube that is inserted into a vein in the upper arm. It is a form of intravenous (IV) access. It is considered to be a "central" line because the tip of the PICC ends in a large vein in your chest. This large vein is called the superior vena cava (SVC). The PICC tip ends in the SVC because there is a lot of blood flow in the SVC. This allows medicines and IV fluids to be quickly distributed throughout the body. The PICC is inserted using a sterile technique by a specially trained nurse or physician. After the PICC is inserted, a chest X-ray exam is done to be sure it is in the correct place.  A PICC may be placed for different reasons, such as:  To give medicines and liquid nutrition that can only be given through a central line. Examples are:  Certain antibiotic treatments.  Chemotherapy.  Total parenteral nutrition (TPN).  To take frequent blood samples.  To give IV fluids and blood products.  If there is difficulty placing a peripheral intravenous (PIV) catheter. If taken care of properly, a PICC can remain in place for several months. A PICC can also allow a person to go home from the hospital early. Medicine and PICC care can be managed at home by a family member or home health care team. WHAT PROBLEMS CAN HAPPEN WHEN I HAVE A PICC? Problems with a PICC can occasionally occur. These may include the following:  A blood clot (thrombus) forming in or at the tip of the PICC. This can cause the PICC to become clogged. A clot-dissolving medicine called tissue plasminogen activator (tPA) can be given through the PICC to help break up the clot.  Inflammation of the vein (phlebitis) in which the PICC is placed. Signs of inflammation may include redness, pain at the insertion site, red streaks, or being able to feel a "cord" in the vein where the PICC is located.  Infection in the PICC or at the insertion  site. Signs of infection may include fever, chills, redness, swelling, or pus drainage from the PICC insertion site.  PICC movement (malposition). The PICC tip may move from its original position due to excessive physical activity, forceful coughing, sneezing, or vomiting.  A break or cut in the PICC. It is important to not use scissors near the PICC.  Nerve or tendon irritation or injury during PICC insertion. WHAT SHOULD I KEEP IN MIND ABOUT ACTIVITIES WHEN I HAVE A PICC?  You may bend your arm and move it freely. If your PICC is near or at the bend of your elbow, avoid activity with repeated motion at the elbow.  Rest at home for the remainder of the day following PICC line insertion.  Avoid lifting heavy objects as instructed by your health care provider.  Avoid using a crutch with the arm on the same side as your PICC. You may need to use a walker. WHAT SHOULD I KNOW ABOUT MY PICC DRESSING?  Keep your PICC bandage (dressing) clean and dry to prevent infection.  Ask your health care provider when you may shower. Ask your health care provider to teach you how to wrap the PICC when you do take a shower.  Change the PICC dressing as instructed by your health care provider.  Change your PICC dressing if it becomes loose or wet. WHAT SHOULD I KNOW ABOUT PICC CARE?  Check the PICC insertion site   daily for leakage, redness, swelling, or pain.  Do not take a bath, swim, or use hot tubs when you have a PICC. Cover PICC line with clear plastic wrap and tape to keep it dry while showering.  Flush the PICC as directed by your health care provider. Let your health care provider know right away if the PICC is difficult to flush or does not flush. Do not use force to flush the PICC.  Do not use a syringe that is less than 10 mL to flush the PICC.  Never pull or tug on the PICC.  Avoid blood pressure checks on the arm with the PICC.  Keep your PICC identification card with you at all  times.  Do not take the PICC out yourself. Only a trained clinical professional should remove the PICC. SEEK IMMEDIATE MEDICAL CARE IF:  Your PICC is accidentally pulled all the way out. If this happens, cover the insertion site with a bandage or gauze dressing. Do not throw the PICC away. Your health care provider will need to inspect it.  Your PICC was tugged or pulled and has partially come out. Do not  push the PICC back in.  There is any type of drainage, redness, or swelling where the PICC enters the skin.  You cannot flush the PICC, it is difficult to flush, or the PICC leaks around the insertion site when it is flushed.  You hear a "flushing" sound when the PICC is flushed.  You have pain, discomfort, or numbness in your arm, shoulder, or jaw on the same side as the PICC.  You feel your heart "racing" or skipping beats.  You notice a hole or tear in the PICC.  You develop chills or a fever. MAKE SURE YOU:   Understand these instructions.  Will watch your condition.  Will get help right away if you are not doing well or get worse.   This information is not intended to replace advice given to you by your health care provider. Make sure you discuss any questions you have with your health care provider.   Document Released: 12/03/2002 Document Revised: 06/19/2014 Document Reviewed: 02/03/2013 Elsevier Interactive Patient Education 2016 [place] Insertion, Care After Refer to this sheet in the next few weeks. These instructions provide you with information on caring for yourself after your procedure. Your health care provider may also give you more specific instructions. Your treatment has been planned according to current medical practices, but problems sometimes occur. Call your health care provider if you have any problems or questions after your procedure. WHAT TO EXPECT AFTER THE PROCEDURE After your procedure, it is typical to have the following:  Mild  discomfort at the insertion site. This should not last more than a day. HOME CARE INSTRUCTIONS  Rest at home for the remainder of the day after the procedure.  You may bend your arm and move it freely. If your PICC is near or at the bend of your elbow, avoid activity with repeated motion at the elbow.  Avoid lifting heavy objects as instructed by your health care provider.  Avoid using a crutch with the arm on the same side as your PICC. You may need to use a walker. Bandage Care  Keep your PICC bandage (dressing) clean and dry to prevent infection.  Ask your health care provider when you may shower. To keep the dressing dry, cover the PICC with plastic wrap and tape before showering. If the dressing does become wet, replace  it right after the shower.  Do not soak in the bath, swim, or use hot tubs when you have a PICC.  Change the PICC dressing as instructed by your health care provider.  Change your PICC dressing if it becomes loose or wet. General PICC Care  Check the PICC insertion site daily for leakage, redness, swelling, or pain.  Flush the PICC as directed by your health care provider. Let your health care provider know right away if the PICC is difficult to flush or does not flush. Do not use force to flush the PICC.  Do not use a syringe that is less than 10 mL to flush the PICC.  Never pull or tug on the PICC.  Avoid blood pressure checks on the arm with the PICC.  Keep your PICC identification card with you at all times.  Do not take the PICC out yourself. Only a trained health care professional should remove the PICC. SEEK MEDICAL CARE IF:  You have pain in your arm, ear, face, or teeth.  You have fever or chills.  You have drainage from the PICC insertion site.  You have redness or palpate a "cord" around the PICC insertion site.  You cannot flush the catheter. SEEK IMMEDIATE MEDICAL CARE IF:  You have swelling in the arm in which the PICC is inserted.    This information is not intended to replace advice given to you by your health care provider. Make sure you discuss any questions you have with your health care provider.   Document Released: 03/19/2013 Document Revised: 06/03/2013 Document Reviewed: 03/19/2013 Elsevier Interactive Patient Education 2016 [place] Catheter/Midline Placement  The IV Nurse has discussed with the patient and/or persons authorized to consent for the patient, the purpose of this procedure and the potential benefits and risks involved with this procedure.  The benefits include less needle sticks, lab draws from the catheter and patient may be discharged home with the catheter.  Risks include, but not limited to, infection, bleeding, blood clot (thrombus formation), and puncture of an artery; nerve damage and irregular heat beat.  Alternatives to this procedure were also discussed.  PICC/Midline Placement Documentation  PICC Single Lumen [id number] PICC Right Basilic 39 cm 1 cm (Active)  Indication for Insertion or Continuance of Line Prolonged intravenous therapies 02/08/2016  2:31 PM  Exposed Catheter (cm) 1 cm 02/08/2016  2:31 PM  Site Assessment Clean;Dry;Intact 02/08/2016  2:31 PM  Line Status Flushed;Capped (central line);Blood return noted 02/08/2016  2:31 PM  Dressing Type Transparent;Securing device 02/08/2016  2:31 PM  Dressing Status Clean;Dry;Intact;Antimicrobial disc in place 02/08/2016  2:31 PM  Line Care Connections checked and tightened 02/08/2016  2:31 PM  Dressing Intervention New dressing 02/08/2016  2:31 PM  Dressing Change Due 02/15/16 02/08/2016  2:31 PM       Naaman Plummer 02/08/2016, 2:34 PM

## 2016-02-09 ENCOUNTER — Encounter (HOSPITAL_COMMUNITY): Payer: Medicare Other

## 2016-02-09 ENCOUNTER — Ambulatory Visit (HOSPITAL_COMMUNITY): Payer: Medicare Other | Admitting: Hematology & Oncology

## 2016-02-09 DIAGNOSIS — C250 Malignant neoplasm of head of pancreas: Secondary | ICD-10-CM

## 2016-02-10 ENCOUNTER — Other Ambulatory Visit (HOSPITAL_COMMUNITY): Payer: Medicare Other

## 2016-02-11 ENCOUNTER — Encounter (HOSPITAL_COMMUNITY): Payer: Medicare Other

## 2016-02-11 ENCOUNTER — Encounter (HOSPITAL_COMMUNITY): Payer: Medicare Other | Attending: Hematology

## 2016-02-11 ENCOUNTER — Encounter: Payer: Self-pay | Admitting: Dietician

## 2016-02-11 ENCOUNTER — Other Ambulatory Visit (HOSPITAL_COMMUNITY): Payer: Self-pay | Admitting: Hematology & Oncology

## 2016-02-11 VITALS — BP 166/60 | HR 62 | Temp 98.2°F | Resp 18 | Wt 187.0 lb

## 2016-02-11 DIAGNOSIS — C250 Malignant neoplasm of head of pancreas: Secondary | ICD-10-CM

## 2016-02-11 DIAGNOSIS — Z5111 Encounter for antineoplastic chemotherapy: Secondary | ICD-10-CM | POA: Diagnosis present

## 2016-02-11 LAB — CBC WITH DIFFERENTIAL/PLATELET
Basophils Absolute: 0 10*3/uL (ref 0.0–0.1)
Basophils Relative: 0 %
EOS PCT: 4 %
Eosinophils Absolute: 0.3 10*3/uL (ref 0.0–0.7)
HCT: 32.7 % — ABNORMAL LOW (ref 36.0–46.0)
Hemoglobin: 10.8 g/dL — ABNORMAL LOW (ref 12.0–15.0)
LYMPHS ABS: 1.7 10*3/uL (ref 0.7–4.0)
LYMPHS PCT: 26 %
MCH: 29.9 pg (ref 26.0–34.0)
MCHC: 33 g/dL (ref 30.0–36.0)
MCV: 90.6 fL (ref 78.0–100.0)
MONO ABS: 0.4 10*3/uL (ref 0.1–1.0)
MONOS PCT: 6 %
Neutro Abs: 4.4 10*3/uL (ref 1.7–7.7)
Neutrophils Relative %: 64 %
PLATELETS: 315 10*3/uL (ref 150–400)
RBC: 3.61 MIL/uL — AB (ref 3.87–5.11)
RDW: 14.5 % (ref 11.5–15.5)
WBC: 6.8 10*3/uL (ref 4.0–10.5)

## 2016-02-11 LAB — COMPREHENSIVE METABOLIC PANEL
ALT: 217 U/L — AB (ref 14–54)
AST: 220 U/L — ABNORMAL HIGH (ref 15–41)
Albumin: 3.3 g/dL — ABNORMAL LOW (ref 3.5–5.0)
Alkaline Phosphatase: 750 U/L — ABNORMAL HIGH (ref 38–126)
Anion gap: 9 (ref 5–15)
BUN: 6 mg/dL (ref 6–20)
CALCIUM: 8.7 mg/dL — AB (ref 8.9–10.3)
CHLORIDE: 97 mmol/L — AB (ref 101–111)
CO2: 26 mmol/L (ref 22–32)
CREATININE: 0.55 mg/dL (ref 0.44–1.00)
Glucose, Bld: 229 mg/dL — ABNORMAL HIGH (ref 65–99)
Potassium: 3.8 mmol/L (ref 3.5–5.1)
Sodium: 132 mmol/L — ABNORMAL LOW (ref 135–145)
Total Bilirubin: 4.3 mg/dL — ABNORMAL HIGH (ref 0.3–1.2)
Total Protein: 7.2 g/dL (ref 6.5–8.1)

## 2016-02-11 MED ORDER — PALONOSETRON HCL INJECTION 0.25 MG/5ML: 0.2500 mg | Freq: Once | INTRAVENOUS | Status: DC

## 2016-02-11 MED ORDER — PALONOSETRON HCL INJECTION 0.25 MG/5ML
INTRAVENOUS | Status: AC
  Filled 2016-02-11: qty 5

## 2016-02-11 MED ORDER — PALONOSETRON HCL INJECTION 0.25 MG/5ML
0.2500 mg | Freq: Once | INTRAVENOUS | Status: AC
  Administered 2016-02-11: 0.25 mg via INTRAVENOUS

## 2016-02-11 MED ORDER — SODIUM CHLORIDE 0.9% FLUSH: 10.0000 mL | INTRAVENOUS | Status: DC | PRN

## 2016-02-11 MED ORDER — PACLITAXEL PROTEIN-BOUND CHEMO INJECTION 100 MG: 125.0000 mg/m2 | Freq: Once | INTRAVENOUS | Status: DC

## 2016-02-11 MED ORDER — PROCHLORPERAZINE MALEATE 10 MG PO TABS
ORAL_TABLET | ORAL | Status: AC
  Filled 2016-02-11: qty 1

## 2016-02-11 MED ORDER — PROCHLORPERAZINE MALEATE 10 MG PO TABS
10.0000 mg | ORAL_TABLET | Freq: Once | ORAL | Status: AC
  Administered 2016-02-11: 10 mg via ORAL

## 2016-02-11 MED ORDER — SODIUM CHLORIDE 0.9 % IV SOLN: 1000.0000 mg/m2 | Freq: Once | INTRAVENOUS | Status: DC

## 2016-02-11 MED ORDER — HEPARIN SOD (PORK) LOCK FLUSH 100 UNIT/ML IV SOLN
250.0000 [IU] | Freq: Once | INTRAVENOUS | Status: AC | PRN
  Administered 2016-02-11: 250 [IU]
  Filled 2016-02-11: qty 5

## 2016-02-11 MED ORDER — SODIUM CHLORIDE 0.9 % IV SOLN
750.0000 mg/m2 | Freq: Once | INTRAVENOUS | Status: AC
  Administered 2016-02-11: 1520 mg via INTRAVENOUS
  Filled 2016-02-11: qty 39.98

## 2016-02-11 MED ORDER — PACLITAXEL PROTEIN-BOUND CHEMO INJECTION 100 MG
79.0000 mg/m2 | Freq: Once | INTRAVENOUS | Status: AC
  Administered 2016-02-11: 150 mg via INTRAVENOUS
  Filled 2016-02-11: qty 30

## 2016-02-11 MED ORDER — SODIUM CHLORIDE 0.9 % IV SOLN
Freq: Once | INTRAVENOUS | Status: AC
Start: 2016-02-11 — End: 2016-02-11
  Administered 2016-02-11: 10:00:00 via INTRAVENOUS

## 2016-02-11 NOTE — Progress Notes (Signed)
Follow up with pancreatic cancer patient  Contacted Pt by visiting during her first chemo session  Wt Readings from Last 10 Encounters:  02/11/16 187 lb (84.8 kg)  02/07/16 191 lb 6.4 oz (86.8 kg)  01/27/16 195 lb (88.5 kg)  01/06/16 197 lb (89.4 kg)  12/22/15 197 lb 9.6 oz (89.6 kg)   Pt has lost 10 lbs since she was seen 1 month ago. Her rate of loss seems to have increase recently as she is down 8 lbs in 2 weeks.   Patient reports oral intake as Poor and worsening. She says her appetite has slowly diminished. She now has little desire to eat anything. Additionally, she is suffering from currently uncontrolled nausea. She takes zofran to effect, but says it only lasts a very short time. When asked if she thought she would eat better if her nausea were better controlled, she said "Yes".    She says her number of meals has remained the same, but the portion size has decreased. She drinks largely water  Discussed with pt that her appetite loss is part of the disease process and is one of the most common symptoms associated with cancer. RD went over recommendations for combating this. Firstly, advised to eat smaller, more frequent meals. She is currently sitting down to eat only 2-3x a day. She should attempt small meals more frequently. RD asked her how she spent her days. She said mostly watching TV. Recommended she keep snacks on hand when watching TV as mindless eating is a habit that will work to her advantage in this case. She says she does like snacking while watching TV. RD gave list and verbal education on the best foods for her to eat while she is watching tv.   Asked her to switch out her water for a calorie containing beverage, ideally one with protein or that is high in kcals  She said her sense of smell is currently normal. RD recommended trying new spices/seasonings to hopefully bring pleasant aromas and arouse her appetite.   She has a very high sensitivity to sweet items. She  noted many foods now are "so sweet". This has largely been the reason she has been opposed to oral supplements. She stated she had tried Glucerna however one of the other family members had stated she actually had not had it. RD presented her with a sample of glucerna as well as a large list of oncology supplements. If she does not like the glucerna, she is told to try some others from the list.   She also was slightly concerned with her BG control as the supplements she has had in the past really increase her BG, as high as >200. Today it was 229. Explained this is more likely related to her malignancy and the meds/treatments then supplements. Glucerna/Boost glucose control in particular should not greatly affect her BG.   Spoke with MD regarding the nausea. She stated she would add aloxi to the patients chemo today.   On return later, pt stated she does like the Glucerna. Will order a case for her.   Left  Handout titled "Soft and Moist High Protein Menu Ideas" and a list of oncology supplements.   Burtis Junes RD, LDN, [place] [facility] Pager: [phone] 02/11/2016 10:21 AM

## 2016-02-11 NOTE — Treatment Plan (Signed)
AST, ALT Bili markedly elevated from baseline. Dose reduced per Dr Whitney Muse. 80/m2 on Abraxane and 750/m2 Gemcitabine.

## 2016-02-12 LAB — CANCER ANTIGEN 19-9: CA 19-9: 150868 U/mL — ABNORMAL HIGH (ref 0–35)

## 2016-02-15 ENCOUNTER — Encounter (HOSPITAL_COMMUNITY): Payer: Self-pay | Admitting: Hematology & Oncology

## 2016-02-15 ENCOUNTER — Encounter (HOSPITAL_BASED_OUTPATIENT_CLINIC_OR_DEPARTMENT_OTHER): Payer: Medicare Other

## 2016-02-15 ENCOUNTER — Telehealth (HOSPITAL_COMMUNITY): Payer: Self-pay | Admitting: *Deleted

## 2016-02-15 ENCOUNTER — Encounter: Payer: Self-pay | Admitting: *Deleted

## 2016-02-15 ENCOUNTER — Encounter (HOSPITAL_COMMUNITY): Payer: Self-pay | Admitting: Emergency Medicine

## 2016-02-15 VITALS — BP 117/77 | HR 74 | Temp 98.6°F | Resp 18

## 2016-02-15 DIAGNOSIS — Z452 Encounter for adjustment and management of vascular access device: Secondary | ICD-10-CM | POA: Diagnosis present

## 2016-02-15 DIAGNOSIS — C250 Malignant neoplasm of head of pancreas: Secondary | ICD-10-CM | POA: Diagnosis not present

## 2016-02-15 MED ORDER — HEPARIN SOD (PORK) LOCK FLUSH 100 UNIT/ML IV SOLN
INTRAVENOUS | Status: AC
  Filled 2016-02-15: qty 5

## 2016-02-15 MED ORDER — HEPARIN SOD (PORK) LOCK FLUSH 100 UNIT/ML IV SOLN
300.0000 [IU] | Freq: Once | INTRAVENOUS | Status: AC
  Administered 2016-02-15: 300 [IU] via INTRAVENOUS

## 2016-02-15 MED ORDER — SODIUM CHLORIDE 0.9% FLUSH
10.0000 mL | Freq: Once | INTRAVENOUS | Status: AC
  Administered 2016-02-15: 10 mL via INTRAVENOUS

## 2016-02-15 NOTE — Progress Notes (Signed)
Colleen Riley presented for PICC line flush. Proper placement of PICC confirmed by CXR. PICC line located right upper arm . Good blood return present. PICC line flushed with 57ml NS and 300U/13ml Heparin. Procedure without incident. Patient tolerated procedure well.  Dressing change to PICC line. Site WNL.  Patient reports mild nausea and fatigue post chemo. Daughters report antiemetics used at home with relief. Patient complains of constipation. She is taking  Stool softeners daily and miralax. Discussed BRAT diet with patient and family. Encouraged to use 1 capful miralax daily unless develops diarrhea. Verbalized understanding.  Stable on discharge home with family via wheelchair.

## 2016-02-15 NOTE — Patient Instructions (Signed)
[place] at [facility] Discharge Instructions  RECOMMENDATIONS MADE BY THE CONSULTANT AND ANY TEST RESULTS WILL BE SENT TO YOUR REFERRING PHYSICIAN.  PICC line dressing change as scheduled.  Thank you for choosing [place] at [facility] to provide your oncology and hematology care.  To afford each patient quality time with our provider, please arrive at least 15 minutes before your scheduled appointment time.   Beginning January 23rd 2017 lab work for the [facility] will be done in the  Main lab at [facility] on 1st floor. If you have a lab appointment with the [facility] please come in thru the  Main Entrance and check in at the main information desk  You need to re-schedule your appointment should you arrive 10 or more minutes late.  We strive to give you quality time with our providers, and arriving late affects you and other patients whose appointments are after yours.  Also, if you no show three or more times for appointments you may be dismissed from the clinic at the providers discretion.     Again, thank you for choosing [facility].  Our hope is that these requests will decrease the amount of time that you wait before being seen by our physicians.       _____________________________________________________________  Should you have questions after your visit to [facility], please contact our office at [phone] between the hours of 8:30 a.m. and 4:30 p.m.  Voicemails left after 4:30 p.m. will not be returned until the following business day.  For prescription refill requests, have your pharmacy contact our office.         Resources For Cancer Patients and their Caregivers ? American Cancer Society: Can assist with transportation, wigs, general needs, runs Look Good Feel Better.        706-254-8414 ? Cancer Care: Provides financial assistance, online support groups, medication/co-pay  assistance.  1-800-813-HOPE 720-681-5058) ? [place] Assists [place] cancer patients and their families through emotional , educational and financial support.  770-343-5808 ? [place] DSS Where to apply for food stamps, Medicaid and utility assistance. 902 767 9553 ? [facility]: Transportation to medical appointments. (610) 053-3014 ? Social Security Administration: May apply for disability if have a Stage IV cancer. 770-019-8809 (431) [phone] ? [employer], Disability and Transit Services: Assists with nutrition, care and transit needs. [place] Support Programs: @10RELATIVEDAYS @ > Cancer Support Group  2nd Tuesday of the month 1pm-2pm, Journey Room  > Creative Journey  3rd Tuesday of the month 1130am-1pm, Journey Room  > Look Good Feel Better  1st Wednesday of the month 10am-12 noon, Journey Room (Call [place] to register [phone])

## 2016-02-15 NOTE — Progress Notes (Signed)
[place] Clinical Social Work  Clinical Social Work was referred by patient's son for assessment of psychosocial needs due to request to complete Cancer Care application. Clinical Social Worker returned his call to offer support and assess for needs.  CSW reviewed process of Cancer Care application and explained need for proof of income in order to submit form/application. Son stated understanding and will gather and submit in near future. CSW inquired if there were other needs currently. He reports that his sisters are helping with care in the home and [facility] has started with pt receiving PT, OT and RN. He shared someone helped "get her cleaned up today". He was not sure which agency and was planning to contact them shortly for more information. CSW encouraged him to reach out as needed.    Clinical Social Work interventions: Resource education and assistance  [place], [employer] Tuesdays   [phone]

## 2016-02-15 NOTE — Progress Notes (Signed)
24 hour call back- spoke with son Suezanne Jacquet pt had a good weekend, pain well control, changed her pain patch a day early.  Pt does seem to be weak eyed.  She does deem to get confused easily.  She is needing help with ambulation.  Pt has been eating well.

## 2016-02-16 NOTE — Telephone Encounter (Signed)
See clinic visit note

## 2016-02-17 ENCOUNTER — Telehealth (HOSPITAL_COMMUNITY): Payer: Self-pay | Admitting: Emergency Medicine

## 2016-02-17 ENCOUNTER — Encounter (HOSPITAL_COMMUNITY): Payer: Self-pay | Admitting: Hematology & Oncology

## 2016-02-17 NOTE — Telephone Encounter (Signed)
Pts son Suezanne Jacquet called and stated that Colleen Riley wanted to stop chemotherapy.  I asked what there goals were.  Hospice? Home health? They were not ready for hospice yet.  He would call me when they are.  She is still going to come in bi-weekly for PICC care until she see Dr Whitney Muse for Follow up care and we will pull the picc line.

## 2016-02-18 ENCOUNTER — Encounter (HOSPITAL_COMMUNITY): Payer: Medicare Other

## 2016-02-18 ENCOUNTER — Other Ambulatory Visit: Payer: Self-pay | Admitting: Pharmacist

## 2016-02-18 ENCOUNTER — Telehealth (HOSPITAL_COMMUNITY): Payer: Self-pay | Admitting: Emergency Medicine

## 2016-02-18 ENCOUNTER — Ambulatory Visit (HOSPITAL_COMMUNITY): Payer: Medicare Other

## 2016-02-18 NOTE — Telephone Encounter (Signed)
Son ben called and stated that Kista did not want to come in for PICC line flush today.  I stated that I was going to try to get in touch with Home health to see if I could set up for them to come to the home to do her flushes for next week. I cancelled her appt for today

## 2016-02-21 ENCOUNTER — Encounter (HOSPITAL_COMMUNITY): Payer: Self-pay | Admitting: Hematology & Oncology

## 2016-02-21 NOTE — Telephone Encounter (Signed)
Called pts son because he wanted to move her appt up to this week because he could not make it to her appt next week.  There are no available appt this week.  He understood.  He does want Dr Whitney Muse to have the hospice talk with the family. Nivriti herself does not want any bad news.  I will let Dr Whitney Muse know this information.

## 2016-02-22 ENCOUNTER — Encounter (HOSPITAL_COMMUNITY): Payer: Medicare Other

## 2016-02-25 ENCOUNTER — Encounter (HOSPITAL_COMMUNITY): Payer: Medicare Other

## 2016-02-25 ENCOUNTER — Ambulatory Visit (HOSPITAL_COMMUNITY): Payer: Medicare Other

## 2016-02-25 NOTE — Progress Notes (Signed)
[name], MD [place] / [place] [id number]   DIAGNOSIS: Pancreatic Mass  SUMMARY OF ONCOLOGIC HISTORY:   Pancreatic cancer ([place])   08/24/2015 Imaging    CT abdomen/pelvis with contrast, with no evidence acute intra abdominal or intrapelvic abnormality, no CT evicdence pancreatitis. no pancreatic mass or pancreatic ductal dilatation senn. Colonic diverticulosis      09/08/2015 Imaging    MRI abdomen [place] with mild increase caliber of common bile duct status post cholecystecomy. Focal narrowing within the dital common bile duct may represent stricture.       12/08/2015 [facility], 3 x 2.7 x 3 cm hypoechoic ill defined focus in the pancreatic head, pancreatic duct dilatation of 4 mm noted.       12/30/2015 PET scan    Marked hypermetabolic pancreatic head mass with hypermetabolic retroperitoneal LAD, hypermetabolic RUL pulmonary nodule, could be a metastatic lesion or primary bronchogenic neoplasm. appears to be hypermetabolic LAD in the R hilum      01/27/2016 Procedure    Upper EUSround mass was identified in the pancreatic head. The mass was hypoechoic and heterogenous. Themass measured 45 mm in maximal cross-sectional diameter. The endosonographic borders were poorly defined.The mass appears to invade the portal vein and involves the SMV at the level of the confluence.The mass also abuts the SMA, suggesting invasion       01/27/2016 Pathology Results    FINE NEEDLE ASPIRATION, PANCREAS, HEAD(SPECIMEN 1 OF 1 COLLECTED 01/27/16): MALIGNANT CELLS CONSISTENT WITH ADENOCARCINOMA        CURRENT THERAPY: NONE  INTERVAL HISTORY: Colleen Riley 82 y.o. female returns for follow-up of pancreatic carcinoma. She tried one cycle of abraxane/gemzar and opted to not do further therapy.   She is weak. She still lives alone. Family does come in to help frequently. She gets around some, but is doing so less and less. Appetite is marginal at  best. Weight is reasonably stable.   Colleen Riley is accompanied by her two daughters and presents in a wheelchair.  She denies any pain at this time. She reports weakness in her legs.   She eats a little bit, not too much. Her daughter notes she eats better in the morning - eating a decent breakfast. Towards the middle of the day she does not eat as much. Her daughter questions if this is normal. The patient enjoys strawberry milkshakes.  Her daughter notes Colleen Riley hates her Miralax. If she does not take the Miralax then her bowels back up and she complains. She drinks a lot of water.   She continues to live at home and receives help from her kids and home health care. She lives in [place].   The patient is agreeable to meeting with palliative care to discuss their services. Family is realistically ready for hospice.   MEDICAL HISTORY: Past Medical History:  Diagnosis Date  . Arthritis   . Depression   . Diabetes mellitus without complication ([place])   . GERD (gastroesophageal reflux disease)   . Hypertension   . Redness    both inner thighs comes and goes    SURGICAL HISTORY: Past Surgical History:  Procedure Laterality Date  . CHOLECYSTECTOMY    . EUS N/A 01/27/2016   Procedure: ESOPHAGEAL ENDOSCOPIC ULTRASOUND (EUS) RADIAL;  Surgeon: Milus Banister, MD;  Location: [facility];  Service: Endoscopy;  Laterality: N/A;  . JOINT REPLACEMENT     right hip  replacement  . JOINT REPLACEMENT     right knee replacement    SOCIAL HISTORY: Social History   Social History  . Marital status: Widowed    Spouse name: N/A  . Number of children: N/A  . Years of education: N/A   Occupational History  . Not on file.   Social History Main Topics  . Smoking status: Never Smoker  . Smokeless tobacco: Never Used  . Alcohol use No  . Drug use: No  . Sexual activity: No   Other Topics Concern  . Not on file   Social History Narrative  . No narrative on file   Patient lives alone, multiple children. Strong support group.  FAMILY HISTORY: Family History  Problem Relation Age of Onset  . Heart failure Mother   . Diabetes Father   . Cancer Sister     Review of Systems  Constitutional: Positive for malaise/fatigue.       Leg weakness Decreased appetite  HENT: Negative.   Eyes: Negative.   Respiratory: Negative.   Cardiovascular: Negative.   Genitourinary: Negative.   Musculoskeletal: Positive for joint pain.  Skin: Negative.   Neurological: Positive for weakness.  Endo/Heme/Allergies: Negative.   Psychiatric/Behavioral: Negative.   14 point review of systems was performed and is negative except as detailed under history of present illness and above   PHYSICAL EXAMINATION  ECOG PERFORMANCE STATUS: 1 - Symptomatic but completely ambulatory   Vitals with BMI 02/28/2016  Height   Weight   BMI   Systolic Q000111Q  Diastolic 56  Pulse 66  Respirations 18   Physical Exam  Constitutional: She is oriented to person, place, and time and well-developed, well-nourished, and in no distress.  In Wheelchair, well groomed  HENT:  Head: Normocephalic and atraumatic.  Nose: Nose normal.  Mouth/Throat: Oropharynx is clear and moist. No oropharyngeal exudate.  Eyes: Conjunctivae and EOM are normal. Pupils are equal, round, and reactive to light. Right eye exhibits no discharge. Left eye exhibits no discharge. No scleral icterus.  Neck: Normal range of motion. Neck supple. No tracheal deviation present. No thyromegaly present.  Cardiovascular: Normal rate, regular rhythm and normal heart sounds.  Exam reveals no gallop and no friction rub.   No murmur heard. Pulmonary/Chest: Effort normal and breath sounds normal. She has no wheezes. She has no rales.  Abdominal: Soft. Bowel sounds are normal. She exhibits no distension and no mass. There is no tenderness. There is no rebound and no guarding.  Musculoskeletal: Normal range of motion. She exhibits  no edema.  Lymphadenopathy:    She has no cervical adenopathy.  Neurological: She is alert and oriented to person, place, and time. She has normal reflexes. No cranial nerve deficit. Gait normal. Coordination normal.  Skin: Skin is warm and dry. No rash noted.  Psychiatric: Mood, memory, affect and judgment normal.  Nursing note and vitals reviewed.   LABORATORY DATA: I have reviewed the data as listed. CBC    Component Value Date/Time   WBC 6.8 02/11/2016 0851   RBC 3.61 (L) 02/11/2016 0851   HGB 10.8 (L) 02/11/2016 0851   HCT 32.7 (L) 02/11/2016 0851   PLT 315 02/11/2016 0851   MCV 90.6 02/11/2016 0851   MCH 29.9 02/11/2016 0851   MCHC 33.0 02/11/2016 0851   RDW 14.5 02/11/2016 0851   LYMPHSABS 1.7 02/11/2016 0851   MONOABS 0.4 02/11/2016 0851   EOSABS 0.3 02/11/2016 0851   BASOSABS 0.0 02/11/2016 0851    CMP  Component Value Date/Time   NA 132 (L) 02/11/2016 0851   K 3.8 02/11/2016 0851   CL 97 (L) 02/11/2016 0851   CO2 26 02/11/2016 0851   GLUCOSE 229 (H) 02/11/2016 0851   BUN 6 02/11/2016 0851   CREATININE 0.55 02/11/2016 0851   CALCIUM 8.7 (L) 02/11/2016 0851   PROT 7.2 02/11/2016 0851   ALBUMIN 3.3 (L) 02/11/2016 0851   AST 220 (H) 02/11/2016 0851   ALT 217 (H) 02/11/2016 0851   ALKPHOS 750 (H) 02/11/2016 0851   BILITOT 4.3 (H) 02/11/2016 0851   GFRNONAA >60 02/11/2016 0851   GFRAA >60 02/11/2016 [id number]      PATHOLOGY:    RADIOGRAPHIC STUDIES: I have personally reviewed the radiological images as listed and agreed with the findings in the report. Study Result   CLINICAL DATA:  Initial treatment strategy for pancreatic cancer.  EXAM: NUCLEAR MEDICINE PET SKULL BASE TO THIGH  TECHNIQUE: 9.9 mCi F-18 FDG was injected intravenously. Full-ring PET imaging was performed from the skull base to thigh after the radiotracer. CT data was obtained and used for attenuation correction and anatomic localization.  FASTING BLOOD GLUCOSE:  Value: 170  mg/dl  COMPARISON:  None.  FINDINGS: NECK  No hypermetabolic lymph nodes in the neck.  CHEST  11 mm right upper lobe pulmonary nodule is hypermetabolic with SUV max = 9.4. Hypermetabolism in the right hilum is suspicious for metastatic lymphadenopathy although a discrete lymph node cannot be identified on the CT scan performed without intravenous contrast material.  Lung windows show emphysema. Scattered small bilateral pulmonary nodules are below the threshold for reliable resolution on PET imaging.  ABDOMEN/PELVIS  Lesion in the head of the pancreas measures approximately 4.1 cm. Hypermetabolism in this lesion demonstrates SUV max = 8.0. Small left para-aortic lymph node (Image 102 series 4) is hypermetabolic with SUV max = 4.9 and abnormal soft tissue between the SMA in the aorta (image 105 series 4) is hypermetabolic with SUV max = 7.3.  Gallbladder surgically absent. Right renal cysts noted. Small volume intraperitoneal free fluid.  SKELETON  Patient is status post right hip replacement.  IMPRESSION: 1. Markedly hypermetabolic pancreatic head mass with hypermetabolic retroperitoneal lymphadenopathy. 2. Hypermetabolic right upper lobe pulmonary nodule. This could be a metastatic lesion or primary bronchogenic neoplasm. There appears to be hypermetabolic lymphadenopathy in the right hilum although this is not clearly discernible on uninfused CT imaging. 3. Emphysema 4. Abdominal aortic atherosclerosis.   Electronically Signed   By: Misty Stanley M.D.   On: 12/30/2015 14:36    ASSESSMENT and THERAPY PLAN:  Stage IV Pancreatic Carcinoma EUS with Dr. Ardis Hughs on 01/27/2016 RUL pulmonary nodule Retroperitoneal adenopathy Abdominal pain Elevated CA 19-9 Hyponatremia PS 2   I spoke with the patient and her family about palliative care versus home health care. The patient is agreeable to meeting with palliative care to discuss their services. I  will refer the patient to [facility] palliative care. Everyone is in agreement that more help is needed at home. The patient notes that she wishes to have her symptoms managed at home. She also acknowledges that she does not want further therapy. Her daughters are both currently taking turns staying with her at home.   I have refilled her fentanyl patches today. A total of 25 minutes was spent with the patient and her family today.   She will return to our clinic as needed.   All questions were answered. The patient knows to call the clinic with any  problems, questions or concerns. We can certainly see the patient much sooner if necessary.  This document serves as a record of services personally performed by Ancil Linsey, MD. It was created on her behalf by Arlyce Harman, a trained medical scribe. The creation of this record is based on the scribe's personal observations and the provider's statements to them. This document has been checked and approved by the attending provider.  I have reviewed the above documentation for accuracy and completeness and I agree with the above.  This note was electronically signed. Molli Hazard, MD  02/28/2016

## 2016-02-28 ENCOUNTER — Encounter (HOSPITAL_BASED_OUTPATIENT_CLINIC_OR_DEPARTMENT_OTHER): Payer: Medicare Other | Admitting: Hematology & Oncology

## 2016-02-28 ENCOUNTER — Encounter (HOSPITAL_COMMUNITY): Payer: Medicare Other

## 2016-02-28 ENCOUNTER — Encounter (HOSPITAL_COMMUNITY): Payer: Self-pay | Admitting: Hematology & Oncology

## 2016-02-28 ENCOUNTER — Encounter (HOSPITAL_COMMUNITY): Payer: Self-pay | Admitting: Lab

## 2016-02-28 DIAGNOSIS — C250 Malignant neoplasm of head of pancreas: Secondary | ICD-10-CM

## 2016-02-28 DIAGNOSIS — G893 Neoplasm related pain (acute) (chronic): Secondary | ICD-10-CM | POA: Diagnosis not present

## 2016-02-28 DIAGNOSIS — Z7189 Other specified counseling: Secondary | ICD-10-CM

## 2016-02-28 MED ORDER — FENTANYL 50 MCG/HR TD PT72: 50.0000 ug | MEDICATED_PATCH | TRANSDERMAL | 0 refills | Status: AC

## 2016-02-28 NOTE — Progress Notes (Unsigned)
Referral sent to [facility].  Records faxed on 9/18.  They will call son.

## 2016-02-28 NOTE — Patient Instructions (Signed)
[place] at [facility] Discharge Instructions  RECOMMENDATIONS MADE BY THE CONSULTANT AND ANY TEST RESULTS WILL BE SENT TO YOUR REFERRING PHYSICIAN.  You saw Dr. Whitney Muse today. You are being referred to hospice.  Prescription for pain patches.   Thank you for choosing [place] at [facility] to provide your oncology and hematology care.  To afford each patient quality time with our provider, please arrive at least 15 minutes before your scheduled appointment time.   Beginning January 23rd 2017 lab work for the [facility] will be done in the  Main lab at [facility] on 1st floor. If you have a lab appointment with the [facility] please come in thru the  Main Entrance and check in at the main information desk  You need to re-schedule your appointment should you arrive 10 or more minutes late.  We strive to give you quality time with our providers, and arriving late affects you and other patients whose appointments are after yours.  Also, if you no show three or more times for appointments you may be dismissed from the clinic at the providers discretion.     Again, thank you for choosing [facility].  Our hope is that these requests will decrease the amount of time that you wait before being seen by our physicians.       _____________________________________________________________  Should you have questions after your visit to [facility], please contact our office at [phone] between the hours of 8:30 a.m. and 4:30 p.m.  Voicemails left after 4:30 p.m. will not be returned until the following business day.  For prescription refill requests, have your pharmacy contact our office.         Resources For Cancer Patients and their Caregivers ? American Cancer Society: Can assist with transportation, wigs, general needs, runs Look Good Feel Better.        ([phone] [phone] ? Cancer Care: Provides financial  assistance, online support groups, medication/co-pay assistance.  1-800-813-HOPE 760 817 7559) ? [employer] Assists [place] cancer patients and their families through emotional , educational and financial support.  667-052-8519 ? [place] DSS Where to apply for food stamps, Medicaid and utility assistance. 406-348-8890 ? [employer]: Transportation to medical appointments. 503 713 5271 ? Social Security Administration: May apply for disability if have a Stage IV cancer. 615-490-9694 515 395 4170 ? [employer], Disability and Transit Services: Assists with nutrition, care and transit needs. [place] Support Programs: @10RELATIVEDAYS @ > Cancer Support Group  2nd Tuesday of the month 1pm-2pm, Journey Room  > Creative Journey  3rd Tuesday of the month 1130am-1pm, Journey Room  > Look Good Feel Better  1st Wednesday of the month 10am-12 noon, Journey Room (Call [place] to register [phone])

## 2016-02-28 NOTE — Progress Notes (Signed)
PICC line removed by home health nurse last Tuesday per patient.  Site assessed.  No s/s infection or any issue.  Patient reports no discomfort.

## 2016-03-01 ENCOUNTER — Encounter: Payer: Self-pay | Admitting: Hematology

## 2016-03-07 ENCOUNTER — Encounter: Payer: Self-pay | Admitting: Dietician

## 2016-03-07 ENCOUNTER — Telehealth (HOSPITAL_COMMUNITY): Payer: Self-pay | Admitting: Emergency Medicine

## 2016-03-07 NOTE — Telephone Encounter (Signed)
Spoke with Colleen Riley about how pt is doing.  Had questions about in home care.  Told him to check with hospice first.  gave him the number.  Told to call with any other questions or concerns.

## 2016-03-07 NOTE — Progress Notes (Signed)
Pt has a case of glucerna available at the [facility] pharmacy. She has elected to go hospice. RD called son to ask if they would like this case of Glucerna. He stated he would talk to her and call back.  Later, Son returned call. He stated she did not want the Glucerna and she deferred it. Noted.   Burtis Junes RD, LDN, CNSC Clinical Nutrition Pager: [phone] 03/07/2016 6:12 PM

## 2016-03-09 DIAGNOSIS — Z7189 Other specified counseling: Secondary | ICD-10-CM | POA: Insufficient documentation

## 2016-04-12 DEATH — deceased

## 2016-05-12 DEATH — deceased

## 2016-05-26 ENCOUNTER — Other Ambulatory Visit: Payer: Self-pay | Admitting: Nurse Practitioner

## 2017-04-30 IMAGING — PT NM PET TUM IMG INITIAL (PI) SKULL BASE T - THIGH
8 series · 25 of 25 positions shown · non-contrast
Comparison: None.

CLINICAL DATA: Initial treatment strategy for pancreatic cancer.

EXAM:
NUCLEAR MEDICINE PET SKULL BASE TO THIGH
TECHNIQUE: 9.9 mCi F-18 FDG was injected intravenously. Full-ring PET imaging
was performed from the skull base to thigh after the radiotracer. CT
data was obtained and used for attenuation correction and anatomic
localization.
FASTING BLOOD GLUCOSE:  Value: 170 mg/dl

[Series 3: pet sk_thigh ac · axial · 5.0mm · 4.07mm/px · z∈[-960,-120]mm · 6 of 211 slices shown]
[im 1/211]
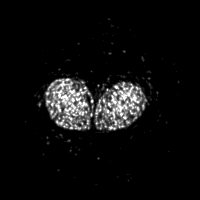
[im 43/211]
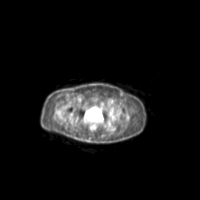
[im 85/211]
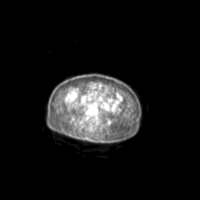
[im 127/211]
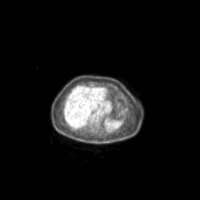
[im 169/211]
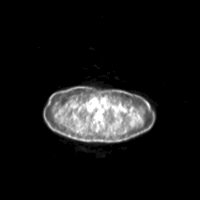
[im 211/211]
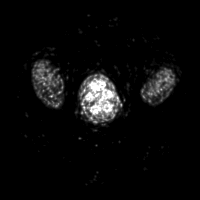

[Series 4: ct sk_thigh 5.0 b31f · axial · 5.0mm · 0.98mm/px · z∈[-960,-120]mm · 5 of 211 slices shown]
[im 1/211]
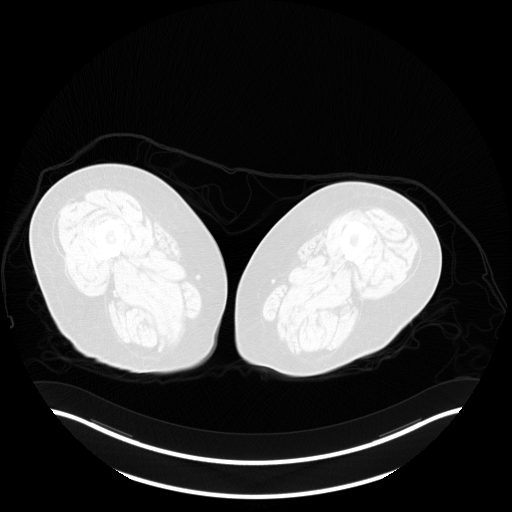
[im 53/211]
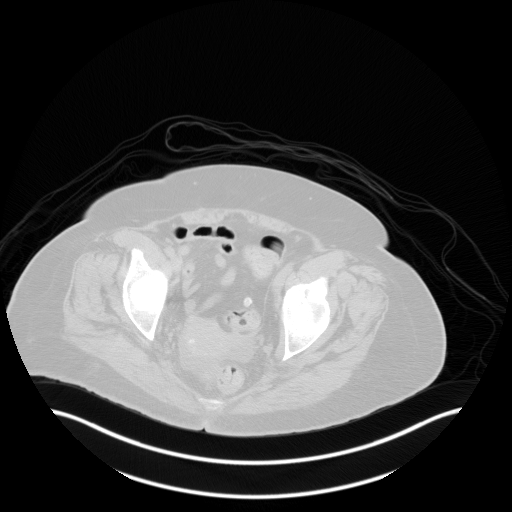
[im 106/211]
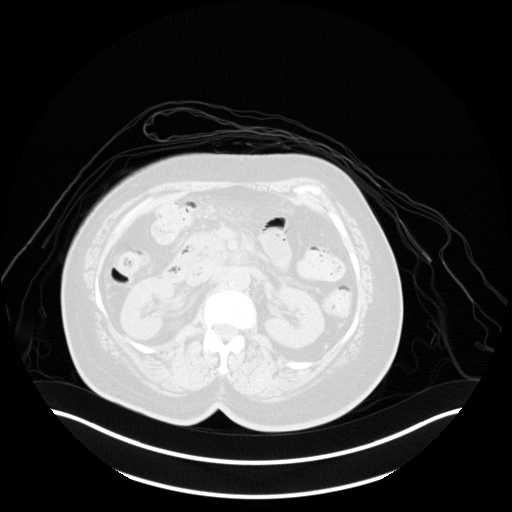
[im 158/211]
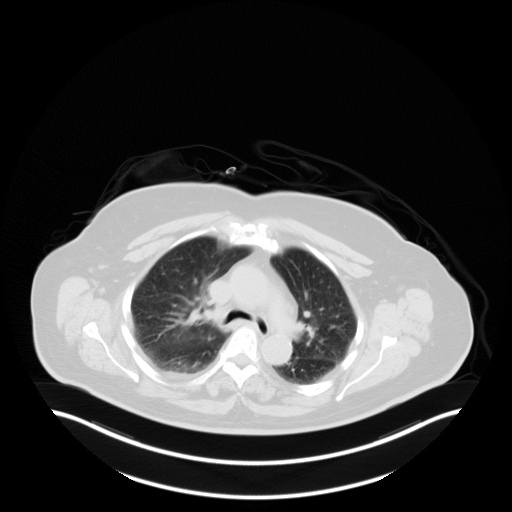
[im 211/211  brain]
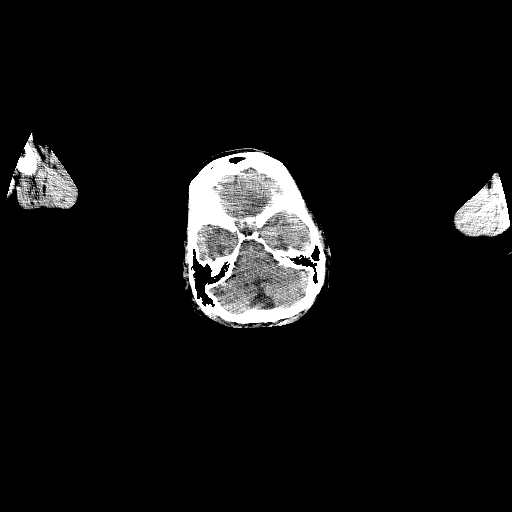

[Series 7: pet sk_thigh nac · axial · 5.0mm · 4.07mm/px · z∈[-960,-120]mm · 5 of 211 slices shown]
[im 1/211]
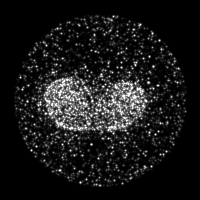
[im 53/211]
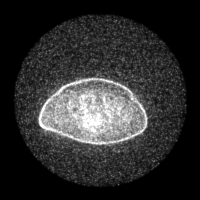
[im 106/211]
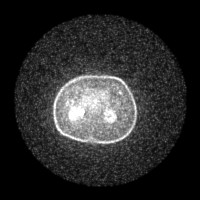
[im 158/211]
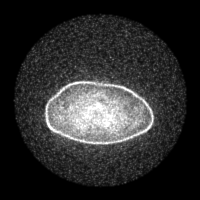
[im 211/211]
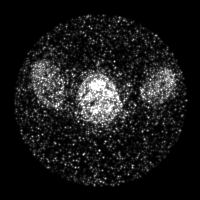

[Series 8: ct sk_thigh 5.0 b70f (id)_bone · axial · 5.0mm · 0.56mm/px · 1 of 55 slices shown]
[im 1/55  bone]
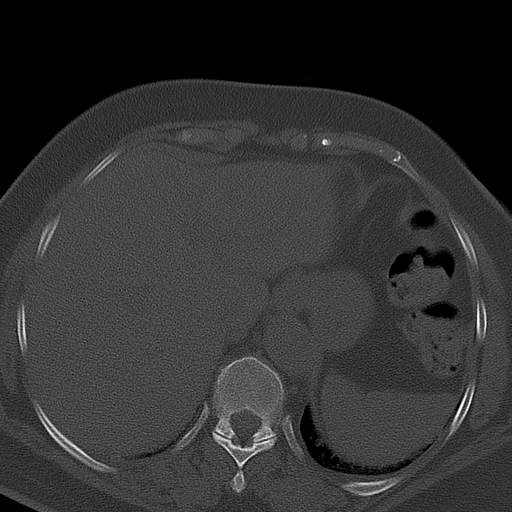

[Series 603: mip collection · coronal · 1.74mm/px · 1 of 32 slices shown]
[im 1/32]
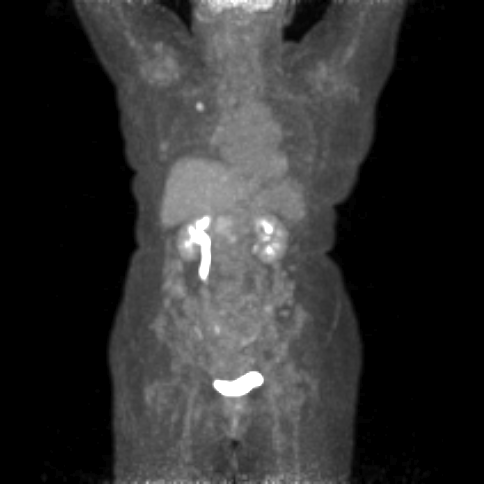

[Series 604: range-ct sk_thigh 5.0 (id)<alpha range> · 1 of 51 slices shown (1 of 2)]
[im 1/51]
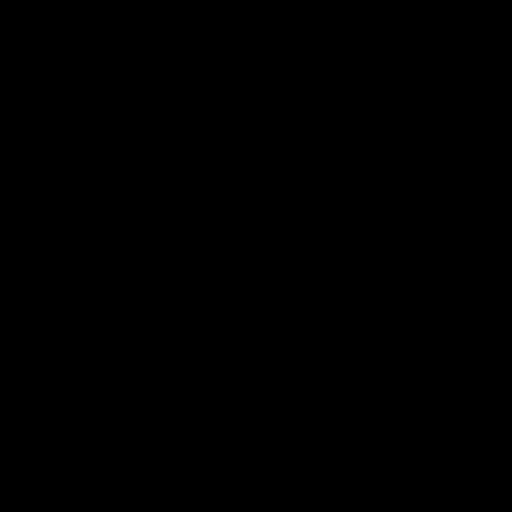

[Series 605: range-ct sk_thigh 5.0 (id)<alpha range> · 5 of 203 slices shown (2 of 2)]
[im 1/203]
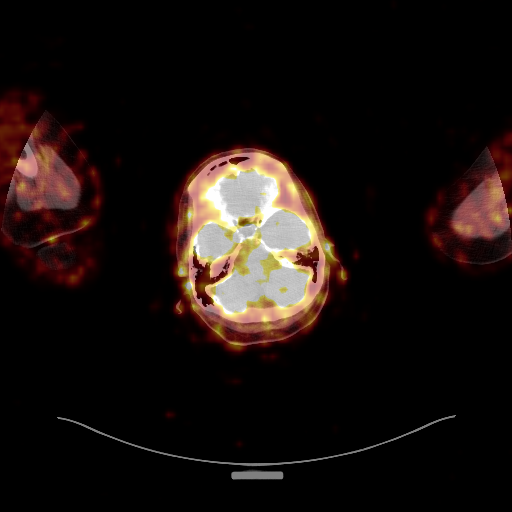
[im 51/203]
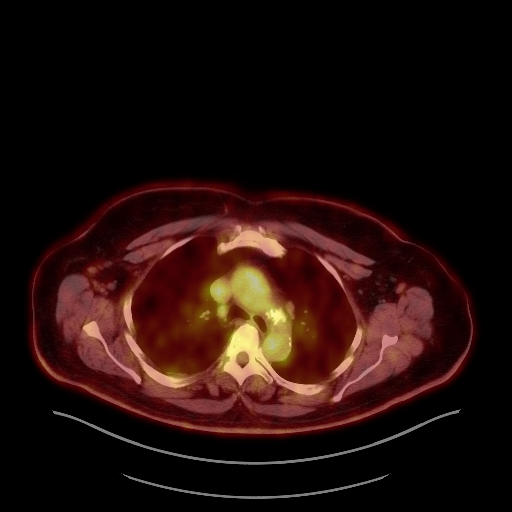
[im 102/203]
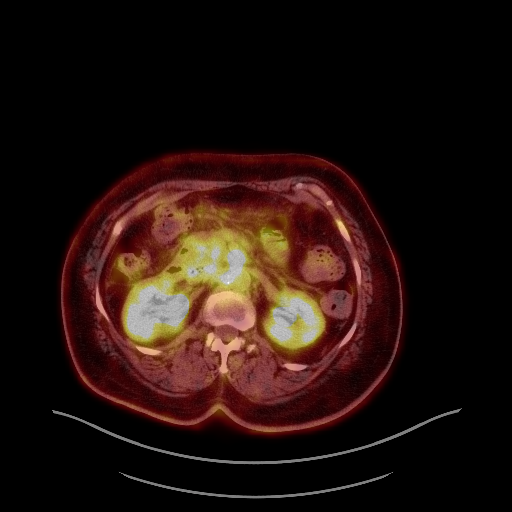
[im 152/203]
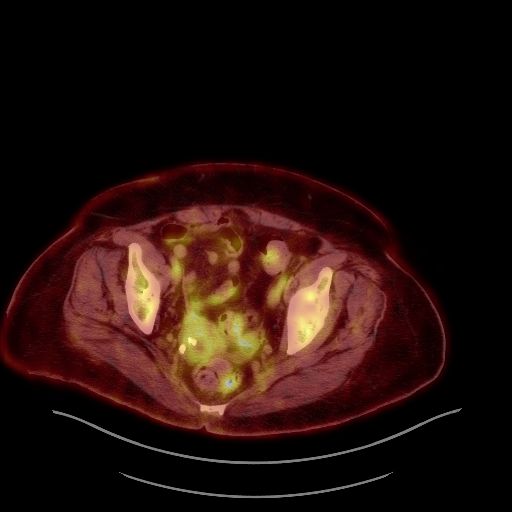
[im 203/203]
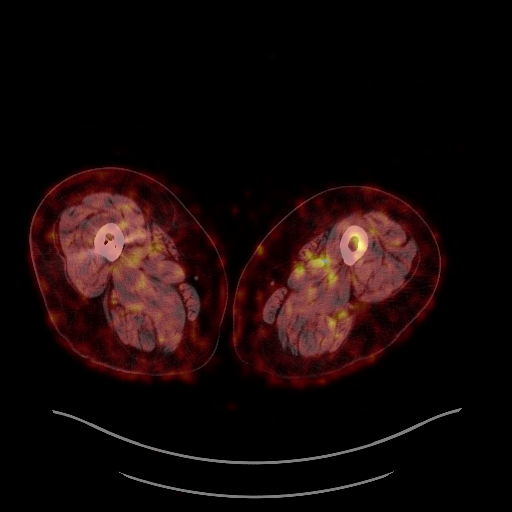

[Series 1032: results mm oncology reading · 5.0mm · 1.12mm/px · 1 of 4 slices shown]
[im 1/4]
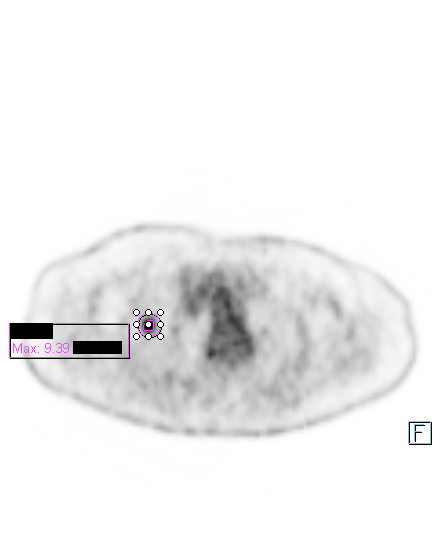

[25 of 25 positions shown; findings below may reference images not displayed]

FINDINGS: NECK

No hypermetabolic lymph nodes in the neck.

CHEST

11 mm right upper lobe pulmonary nodule is hypermetabolic with SUV
max = 9.4. Hypermetabolism in the right hilum is suspicious for
metastatic lymphadenopathy although a discrete lymph node cannot be
identified on the CT scan performed without intravenous contrast
material.

Lung windows show emphysema. Scattered small bilateral pulmonary
nodules are below the threshold for reliable resolution on PET
imaging.

ABDOMEN/PELVIS

Lesion in the head of the pancreas measures approximately 4.1 cm.
Hypermetabolism in this lesion demonstrates SUV max = 8.0. Small
left para-aortic lymph node (Image 102 series 4) is hypermetabolic
with SUV max = 4.9 and abnormal soft tissue between the SMA in the
aorta (image 105 series 4) is hypermetabolic with SUV max = 7.3.

Gallbladder surgically absent. Right renal cysts noted. Small volume
intraperitoneal free fluid.

SKELETON

Patient is status post right hip replacement.
IMPRESSION: 1. Markedly hypermetabolic pancreatic head mass with hypermetabolic
retroperitoneal lymphadenopathy.
2. Hypermetabolic right upper lobe pulmonary nodule. This could be a
metastatic lesion or primary bronchogenic neoplasm. There appears to
be hypermetabolic lymphadenopathy in the right hilum although this
is not clearly discernible on uninfused CT imaging.
3. Emphysema
4. Abdominal aortic atherosclerosis.
